# Patient Record
Sex: Female | Born: 1960 | Race: Black or African American | Hispanic: No | Marital: Single | Smoking: Never smoker
Health system: Southern US, Community
[De-identification: ages and names within clinical notes are randomized; demographics above are authoritative.]

## PROBLEM LIST (undated history)

## (undated) DIAGNOSIS — I10 Essential (primary) hypertension: Secondary | ICD-10-CM

## (undated) DIAGNOSIS — E119 Type 2 diabetes mellitus without complications: Secondary | ICD-10-CM

## (undated) DIAGNOSIS — F419 Anxiety disorder, unspecified: Secondary | ICD-10-CM

## (undated) DIAGNOSIS — E7800 Pure hypercholesterolemia, unspecified: Secondary | ICD-10-CM

## (undated) HISTORY — PX: TUBAL LIGATION: SHX77

---

## 2005-01-30 ENCOUNTER — Ambulatory Visit: Payer: Self-pay | Admitting: Family Medicine

## 2005-12-03 ENCOUNTER — Ambulatory Visit: Payer: Self-pay | Admitting: Family Medicine

## 2005-12-06 ENCOUNTER — Ambulatory Visit: Payer: Self-pay | Admitting: Family Medicine

## 2009-12-07 ENCOUNTER — Emergency Department (HOSPITAL_COMMUNITY): Admission: EM | Admit: 2009-12-07 | Discharge: 2009-12-07 | Payer: Self-pay | Admitting: Emergency Medicine

## 2010-11-20 ENCOUNTER — Emergency Department (HOSPITAL_COMMUNITY)
Admission: EM | Admit: 2010-11-20 | Discharge: 2010-11-20 | Disposition: A | Discharge status: Home / Self Care | Payer: BC Managed Care – PPO | Attending: Emergency Medicine | Admitting: Emergency Medicine

## 2010-11-20 ENCOUNTER — Emergency Department (HOSPITAL_COMMUNITY): Payer: BC Managed Care – PPO

## 2010-11-20 DIAGNOSIS — R059 Cough, unspecified: Secondary | ICD-10-CM | POA: Insufficient documentation

## 2010-11-20 DIAGNOSIS — R05 Cough: Secondary | ICD-10-CM | POA: Insufficient documentation

## 2010-11-20 DIAGNOSIS — J029 Acute pharyngitis, unspecified: Secondary | ICD-10-CM | POA: Insufficient documentation

## 2010-11-20 LAB — RAPID STREP SCREEN (MED CTR MEBANE ONLY): Streptococcus, Group A Screen (Direct): NEGATIVE

## 2012-07-31 IMAGING — CR DG CHEST 2V
2 series · 2 of 2 positions shown · non-contrast
Comparison: None

CLINICAL DATA: Sore throat and cough

CHEST - 2 VIEW

[view not recorded (1 of 2)]
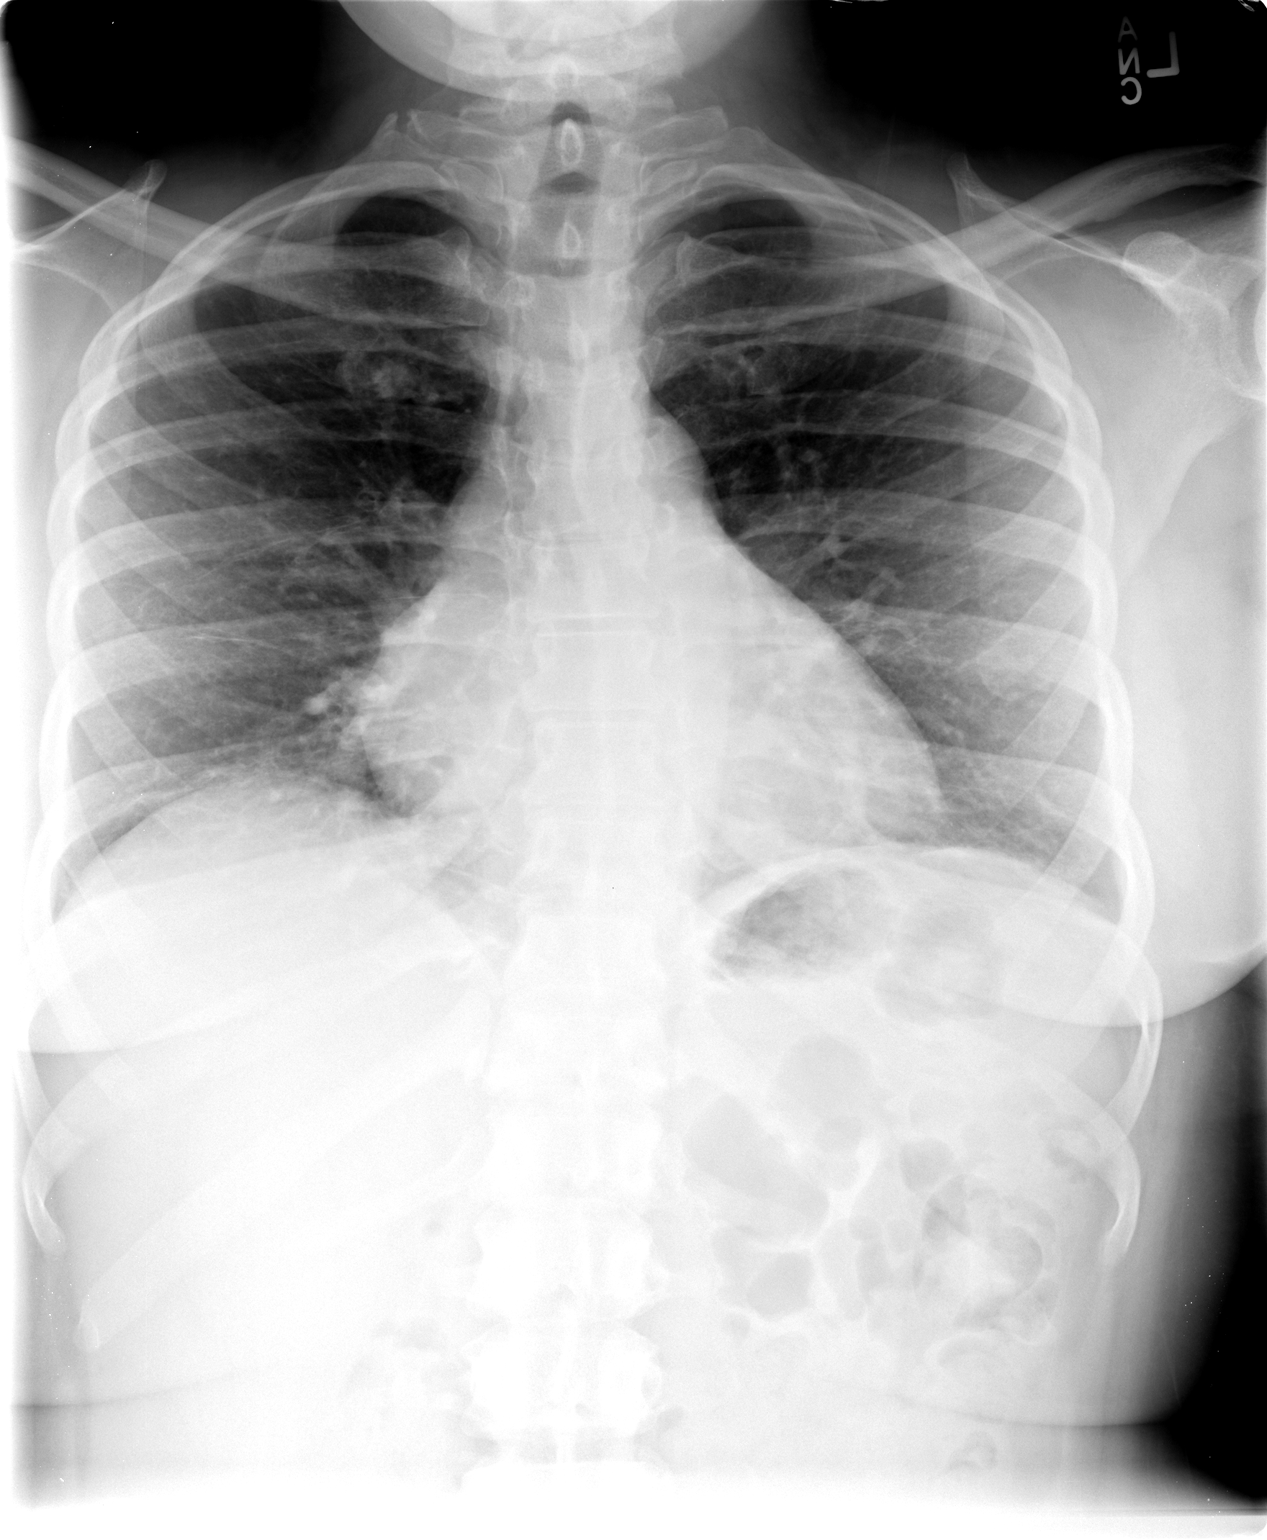

[view not recorded (2 of 2)]
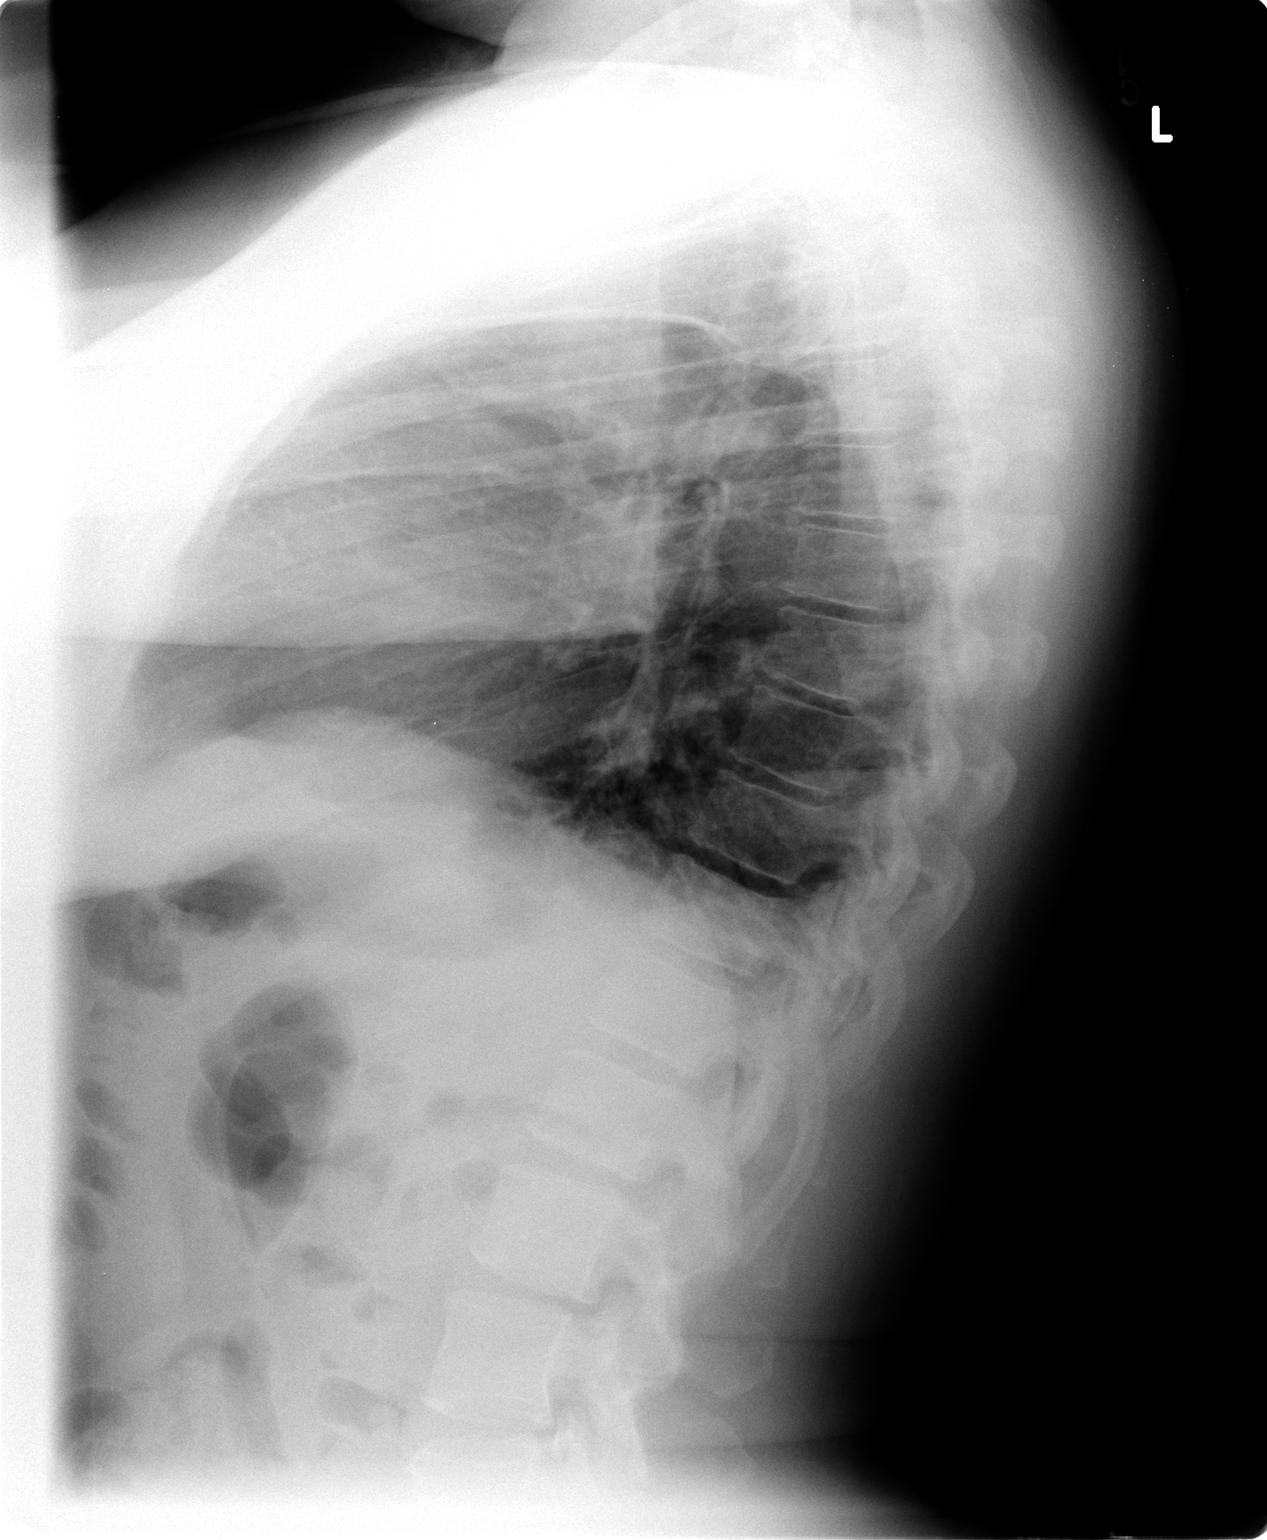

[2 of 2 positions shown; findings below may reference images not displayed]

FINDINGS: The heart size is normal.

No pleural effusion or pulmonary edema.

The lung volumes are low and there is mild bibasilar atelectasis.

No airspace consolidation is identified.

Coarsened interstitial markings are noted bilaterally.
IMPRESSION: 1.  Low lung volumes.
2. Chronic interstitial coarsening.

## 2013-11-30 DIAGNOSIS — D509 Iron deficiency anemia, unspecified: Secondary | ICD-10-CM | POA: Insufficient documentation

## 2015-02-21 DIAGNOSIS — I152 Hypertension secondary to endocrine disorders: Secondary | ICD-10-CM | POA: Insufficient documentation

## 2015-02-21 DIAGNOSIS — E1159 Type 2 diabetes mellitus with other circulatory complications: Secondary | ICD-10-CM | POA: Insufficient documentation

## 2019-08-06 ENCOUNTER — Emergency Department (HOSPITAL_COMMUNITY)
Admission: EM | Admit: 2019-08-06 | Discharge: 2019-08-06 | Disposition: A | Discharge status: Home / Self Care | Payer: BC Managed Care – PPO | Attending: Emergency Medicine | Admitting: Emergency Medicine

## 2019-08-06 ENCOUNTER — Encounter (HOSPITAL_COMMUNITY): Payer: Self-pay

## 2019-08-06 ENCOUNTER — Emergency Department (HOSPITAL_COMMUNITY): Payer: BC Managed Care – PPO

## 2019-08-06 ENCOUNTER — Other Ambulatory Visit: Payer: Self-pay

## 2019-08-06 DIAGNOSIS — R1010 Upper abdominal pain, unspecified: Secondary | ICD-10-CM | POA: Diagnosis present

## 2019-08-06 DIAGNOSIS — K5900 Constipation, unspecified: Secondary | ICD-10-CM | POA: Insufficient documentation

## 2019-08-06 DIAGNOSIS — E119 Type 2 diabetes mellitus without complications: Secondary | ICD-10-CM | POA: Diagnosis not present

## 2019-08-06 DIAGNOSIS — I10 Essential (primary) hypertension: Secondary | ICD-10-CM | POA: Insufficient documentation

## 2019-08-06 DIAGNOSIS — Z7984 Long term (current) use of oral hypoglycemic drugs: Secondary | ICD-10-CM | POA: Diagnosis not present

## 2019-08-06 DIAGNOSIS — Z79899 Other long term (current) drug therapy: Secondary | ICD-10-CM | POA: Diagnosis not present

## 2019-08-06 DIAGNOSIS — R109 Unspecified abdominal pain: Secondary | ICD-10-CM

## 2019-08-06 HISTORY — DX: Essential (primary) hypertension: I10

## 2019-08-06 HISTORY — DX: Type 2 diabetes mellitus without complications: E11.9

## 2019-08-06 HISTORY — DX: Pure hypercholesterolemia, unspecified: E78.00

## 2019-08-06 HISTORY — DX: Anxiety disorder, unspecified: F41.9

## 2019-08-06 LAB — CBC WITH DIFFERENTIAL/PLATELET
Abs Immature Granulocytes: 0.01 10*3/uL (ref 0.00–0.07)
Basophils Absolute: 0 10*3/uL (ref 0.0–0.1)
Basophils Relative: 0 %
Eosinophils Absolute: 0 10*3/uL (ref 0.0–0.5)
Eosinophils Relative: 1 %
HCT: 36 % (ref 36.0–46.0)
Hemoglobin: 11.4 g/dL — ABNORMAL LOW (ref 12.0–15.0)
Immature Granulocytes: 0 %
Lymphocytes Relative: 23 %
Lymphs Abs: 1.4 10*3/uL (ref 0.7–4.0)
MCH: 30.7 pg (ref 26.0–34.0)
MCHC: 31.7 g/dL (ref 30.0–36.0)
MCV: 97 fL (ref 80.0–100.0)
Monocytes Absolute: 0.6 10*3/uL (ref 0.1–1.0)
Monocytes Relative: 10 %
Neutro Abs: 4.1 10*3/uL (ref 1.7–7.7)
Neutrophils Relative %: 66 %
Platelets: 315 10*3/uL (ref 150–400)
RBC: 3.71 MIL/uL — ABNORMAL LOW (ref 3.87–5.11)
RDW: 12.8 % (ref 11.5–15.5)
WBC: 6.1 10*3/uL (ref 4.0–10.5)
nRBC: 0 % (ref 0.0–0.2)

## 2019-08-06 LAB — COMPREHENSIVE METABOLIC PANEL
ALT: 21 U/L (ref 0–44)
AST: 19 U/L (ref 15–41)
Albumin: 4 g/dL (ref 3.5–5.0)
Alkaline Phosphatase: 56 U/L (ref 38–126)
Anion gap: 9 (ref 5–15)
BUN: 12 mg/dL (ref 6–20)
CO2: 27 mmol/L (ref 22–32)
Calcium: 9.3 mg/dL (ref 8.9–10.3)
Chloride: 100 mmol/L (ref 98–111)
Creatinine, Ser: 0.85 mg/dL (ref 0.44–1.00)
GFR calc Af Amer: >60 mL/min (ref >60)
GFR calc non Af Amer: >60 mL/min (ref >60)
Glucose, Bld: 178 mg/dL — ABNORMAL HIGH (ref 70–99)
Potassium: 3.6 mmol/L (ref 3.5–5.1)
Sodium: 136 mmol/L (ref 135–145)
Total Bilirubin: 0.8 mg/dL (ref 0.3–1.2)
Total Protein: 8.5 g/dL — ABNORMAL HIGH (ref 6.5–8.1)

## 2019-08-06 LAB — LIPASE, BLOOD: Lipase: 21 U/L (ref 11–51)

## 2019-08-06 MED ORDER — DICYCLOMINE HCL 20 MG PO TABS: 20.0000 mg | ORAL_TABLET | Freq: Two times a day (BID) | ORAL | 0 refills | Status: DC

## 2019-08-06 NOTE — Discharge Instructions (Addendum)
Test showed no life-threatening condition.  X-ray showed no obvious constipation.  Prescription for abdominal cramping sent to your pharmacy.  Follow-up with your primary care doctor.

## 2019-08-06 NOTE — ED Provider Notes (Addendum)
[facility] EMERGENCY DEPARTMENT Provider Note   CSN: [id number] Arrival date & time: 08/06/19  1007     History Chief Complaint  Patient presents with  . Abdominal Pain    Kiara Kent is a 59 y.o. female.  Upper abdominal pain since Sunday with poor appetite.  No bowel movement 7 days.  Patient has taken Linzess x2 in the past several days without relief.  Past surgical history includes bilateral tubal ligation and cystectomy on ovary.  This pain is atypical.  No fever, sweats, chills, chest pain, dyspnea.  One episode of vomiting on Monday.        Past Medical History:  Diagnosis Date  . Anxiety   . Diabetes mellitus without complication ([place])   . Hypercholesterolemia   . Hypertension     There are no problems to display for this patient.   Past Surgical History:  Procedure Laterality Date  . TUBAL LIGATION       OB History   No obstetric history on file.     No family history on file.  Social History   Tobacco Use  . Smoking status: Never Smoker  . Smokeless tobacco: Never Used  Substance Use Topics  . Alcohol use: Never  . Drug use: Never    Home Medications Prior to Admission medications   Medication Sig Start Date End Date Taking? Authorizing Provider  amLODipine (NORVASC) 5 MG tablet Take 5 mg by mouth daily.   Yes [provider]  buPROPion (WELLBUTRIN XL) 150 MG 24 hr tablet Take 150 mg by mouth every morning. 07/27/19  Yes [provider]  lisinopril (ZESTRIL) 20 MG tablet Take 20 mg by mouth daily.   Yes [provider]  metFORMIN (GLUCOPHAGE) 500 MG tablet Take 500 mg by mouth 2 (two) times daily. 06/06/19  Yes [provider]  simvastatin (ZOCOR) 40 MG tablet Take 40 mg by mouth daily.   Yes [provider]  dicyclomine (BENTYL) 20 MG tablet Take 1 tablet (20 mg total) by mouth 2 (two) times daily. 08/06/19   Nat Christen, MD    Allergies    Patient has no known allergies.  Review of Systems    Review of Systems  All other systems reviewed and are negative.   Physical Exam Updated Vital Signs BP 111/89 (BP Location: Left Arm)   Pulse (!) 115   Temp 99.4 F (37.4 C) (Oral)   Resp 18   Ht 5\' 10"  (1.778 m)   Wt 91.2 kg   SpO2 98%   BMI 28.84 kg/m   Physical Exam Vitals and nursing note reviewed.  Constitutional:      Appearance: She is well-developed.     Comments: nad  HENT:     Head: Normocephalic and atraumatic.  Eyes:     Conjunctiva/sclera: Conjunctivae normal.  Cardiovascular:     Rate and Rhythm: Normal rate and regular rhythm.  Pulmonary:     Effort: Pulmonary effort is normal.     Breath sounds: Normal breath sounds.  Abdominal:     General: Bowel sounds are normal.     Palpations: Abdomen is soft.     Comments: Minimal upper abdominal tenderness.  Musculoskeletal:        General: Normal range of motion.     Cervical back: Neck supple.  Skin:    General: Skin is warm and dry.  Neurological:     General: No focal deficit present.     Mental Status: She is alert and  oriented to person, place, and time.  Psychiatric:        Behavior: Behavior normal.     ED Results / Procedures / Treatments   Labs (all labs ordered are listed, but only abnormal results are displayed) Labs Reviewed  CBC WITH DIFFERENTIAL/PLATELET - Abnormal; Notable for the following components:      Result Value   RBC 3.71 (*)    Hemoglobin 11.4 (*)    All other components within normal limits  COMPREHENSIVE METABOLIC PANEL - Abnormal; Notable for the following components:   Glucose, Bld 178 (*)    Total Protein 8.5 (*)    All other components within normal limits  LIPASE, BLOOD    EKG None  Radiology DG Abdomen Acute W/Chest  Result Date: 08/06/2019 CLINICAL DATA:  Constipation with abdominal pain EXAM: DG ABDOMEN ACUTE W/ 1V CHEST COMPARISON:  None. FINDINGS: There is no evidence of dilated bowel loops or free intraperitoneal air. Mild amount of stool in the colon.  No radiopaque calculi or other significant radiographic abnormality is seen. Heart size and mediastinal contours are within normal limits. Both lungs are clear. IMPRESSION: Negative abdominal radiographs.  No acute cardiopulmonary disease. Electronically Signed   By: Marlan Palau M.D.   On: 08/06/2019 10:19    Procedures Procedures (including critical care time)  Medications Ordered in ED Medications - No data to display  ED Course  I have reviewed the triage vital signs and the nursing notes.  Pertinent labs & imaging results that were available during my care of the patient were reviewed by me and considered in my medical decision making (see chart for details).    MDM Rules/Calculators/A&P                      Patient is in no acute distress.  Could be simple constipation versus SBO.  Basic labs, acute abdominal series.  1230: Patient rechecked prior to discharge.  No acute abdomen.  Discussed labs and x-ray.  Discharge medication Bentyl 20 mg. Final Clinical Impression(s) / ED Diagnoses Final diagnoses:  Abdominal pain, unspecified abdominal location  Constipation, unspecified constipation type    Rx / DC Orders ED Discharge Orders         Ordered    dicyclomine (BENTYL) 20 MG tablet  2 times daily     08/06/19 1240           Donnetta Hutching, MD 08/06/19 7253    Donnetta Hutching, MD 08/06/19 1241

## 2019-08-06 NOTE — ED Triage Notes (Addendum)
Pt reports mid to upper abd pain since Sunday.  Reports has been belching a lot and constipated.  LBM was 7 days ago.  Vomited x 1 on Monday.  Denies any chest pain.

## 2019-08-20 ENCOUNTER — Encounter (HOSPITAL_COMMUNITY): Payer: Self-pay

## 2019-08-20 ENCOUNTER — Encounter (HOSPITAL_COMMUNITY)
Admission: RE | Admit: 2019-08-20 | Discharge: 2019-08-20 | Disposition: A | Discharge status: Home / Self Care | Payer: BC Managed Care – PPO | Source: Ambulatory Visit | Attending: General Surgery | Admitting: General Surgery

## 2019-08-20 ENCOUNTER — Encounter: Payer: Self-pay | Admitting: General Surgery

## 2019-08-20 ENCOUNTER — Emergency Department (HOSPITAL_COMMUNITY)
Admission: EM | Admit: 2019-08-20 | Discharge: 2019-08-20 | Disposition: A | Discharge status: Home / Self Care | Payer: BC Managed Care – PPO | Attending: Emergency Medicine | Admitting: Emergency Medicine

## 2019-08-20 ENCOUNTER — Other Ambulatory Visit: Payer: Self-pay

## 2019-08-20 ENCOUNTER — Emergency Department (HOSPITAL_COMMUNITY): Payer: BC Managed Care – PPO

## 2019-08-20 ENCOUNTER — Ambulatory Visit: Payer: BC Managed Care – PPO | Admitting: General Surgery

## 2019-08-20 VITALS — BP 124/83 | HR 89 | Temp 98.2°F | Resp 12 | Ht 70.0 in | Wt 201.0 lb

## 2019-08-20 DIAGNOSIS — Z7984 Long term (current) use of oral hypoglycemic drugs: Secondary | ICD-10-CM | POA: Diagnosis not present

## 2019-08-20 DIAGNOSIS — Z79899 Other long term (current) drug therapy: Secondary | ICD-10-CM | POA: Diagnosis not present

## 2019-08-20 DIAGNOSIS — E119 Type 2 diabetes mellitus without complications: Secondary | ICD-10-CM | POA: Diagnosis not present

## 2019-08-20 DIAGNOSIS — K8010 Calculus of gallbladder with chronic cholecystitis without obstruction: Secondary | ICD-10-CM | POA: Diagnosis not present

## 2019-08-20 DIAGNOSIS — K819 Cholecystitis, unspecified: Secondary | ICD-10-CM | POA: Insufficient documentation

## 2019-08-20 DIAGNOSIS — R1033 Periumbilical pain: Secondary | ICD-10-CM

## 2019-08-20 DIAGNOSIS — I10 Essential (primary) hypertension: Secondary | ICD-10-CM | POA: Diagnosis not present

## 2019-08-20 LAB — COMPREHENSIVE METABOLIC PANEL
ALT: 13 U/L (ref 0–44)
AST: 12 U/L — ABNORMAL LOW (ref 15–41)
Albumin: 4.3 g/dL (ref 3.5–5.0)
Alkaline Phosphatase: 53 U/L (ref 38–126)
Anion gap: 10 (ref 5–15)
BUN: 9 mg/dL (ref 6–20)
CO2: 27 mmol/L (ref 22–32)
Calcium: 9.5 mg/dL (ref 8.9–10.3)
Chloride: 100 mmol/L (ref 98–111)
Creatinine, Ser: 0.75 mg/dL (ref 0.44–1.00)
GFR calc Af Amer: >60 mL/min (ref >60)
GFR calc non Af Amer: >60 mL/min (ref >60)
Glucose, Bld: 225 mg/dL — ABNORMAL HIGH (ref 70–99)
Potassium: 3.6 mmol/L (ref 3.5–5.1)
Sodium: 137 mmol/L (ref 135–145)
Total Bilirubin: 0.5 mg/dL (ref 0.3–1.2)
Total Protein: 9 g/dL — ABNORMAL HIGH (ref 6.5–8.1)

## 2019-08-20 LAB — URINALYSIS, ROUTINE W REFLEX MICROSCOPIC
Bilirubin Urine: NEGATIVE
Glucose, UA: NEGATIVE mg/dL
Hgb urine dipstick: NEGATIVE
Ketones, ur: NEGATIVE mg/dL
Leukocytes,Ua: NEGATIVE
Nitrite: NEGATIVE
Protein, ur: NEGATIVE mg/dL
Specific Gravity, Urine: >1.046 — ABNORMAL HIGH (ref 1.005–1.030)
pH: 6 (ref 5.0–8.0)

## 2019-08-20 LAB — CBC WITH DIFFERENTIAL/PLATELET
Abs Immature Granulocytes: 0.01 10*3/uL (ref 0.00–0.07)
Basophils Absolute: 0 10*3/uL (ref 0.0–0.1)
Basophils Relative: 0 %
Eosinophils Absolute: 0 10*3/uL (ref 0.0–0.5)
Eosinophils Relative: 0 %
HCT: 36.6 % (ref 36.0–46.0)
Hemoglobin: 11.6 g/dL — ABNORMAL LOW (ref 12.0–15.0)
Immature Granulocytes: 0 %
Lymphocytes Relative: 17 %
Lymphs Abs: 0.9 10*3/uL (ref 0.7–4.0)
MCH: 30.5 pg (ref 26.0–34.0)
MCHC: 31.7 g/dL (ref 30.0–36.0)
MCV: 96.3 fL (ref 80.0–100.0)
Monocytes Absolute: 0.3 10*3/uL (ref 0.1–1.0)
Monocytes Relative: 5 %
Neutro Abs: 4.1 10*3/uL (ref 1.7–7.7)
Neutrophils Relative %: 78 %
Platelets: 324 10*3/uL (ref 150–400)
RBC: 3.8 MIL/uL — ABNORMAL LOW (ref 3.87–5.11)
RDW: 12.7 % (ref 11.5–15.5)
WBC: 5.3 10*3/uL (ref 4.0–10.5)
nRBC: 0 % (ref 0.0–0.2)

## 2019-08-20 LAB — LIPASE, BLOOD: Lipase: 21 U/L (ref 11–51)

## 2019-08-20 MED ORDER — ONDANSETRON HCL 8 MG PO TABS: 8.0000 mg | ORAL_TABLET | ORAL | 0 refills | Status: DC | PRN

## 2019-08-20 MED ORDER — ONDANSETRON HCL 4 MG/2ML IJ SOLN
4.0000 mg | Freq: Once | INTRAMUSCULAR | Status: AC
  Administered 2019-08-20: 4 mg via INTRAVENOUS
  Filled 2019-08-20: qty 2

## 2019-08-20 MED ORDER — IOHEXOL 300 MG/ML  SOLN
100.0000 mL | Freq: Once | INTRAMUSCULAR | Status: AC | PRN
  Administered 2019-08-20: 07:00:00 100 mL via INTRAVENOUS

## 2019-08-20 MED ORDER — SODIUM CHLORIDE 0.9 % IV BOLUS
1000.0000 mL | Freq: Once | INTRAVENOUS | Status: AC
  Administered 2019-08-20: 1000 mL via INTRAVENOUS

## 2019-08-20 MED ORDER — OXYCODONE-ACETAMINOPHEN 5-325 MG PO TABS: 1.0000 | ORAL_TABLET | ORAL | 0 refills | Status: DC | PRN

## 2019-08-20 MED ORDER — FENTANYL CITRATE (PF) 100 MCG/2ML IJ SOLN
50.0000 ug | Freq: Once | INTRAMUSCULAR | Status: AC
  Administered 2019-08-20: 50 ug via INTRAVENOUS
  Filled 2019-08-20: qty 2

## 2019-08-20 MED ORDER — PANTOPRAZOLE SODIUM 20 MG PO TBEC: 20.0000 mg | DELAYED_RELEASE_TABLET | Freq: Two times a day (BID) | ORAL | 0 refills | Status: DC

## 2019-08-20 NOTE — Progress Notes (Signed)
[facility] History and Physical  Reason for Referral: Gallstones  Referring Physician:  Dr. Adriana Simas (ED)  Chief Complaint    New Patient (Initial Visit)      Kiara Kent is a 59 y.o. female.  HPI:  Kiara Kent is a 59 yo who went to the ED at [facility] and had been having 2 weeks of upper abdominal pain. She has a history of constipation and IBS and is on Linzess and was started on Bentyl. She says the pain has continued and is worsening. She says that at first it was aggravated by food but now is constant and is a ache.  She says it is mostly in the upper abdomen.  She has associated nausea and some vomiting. She says she really has not eaten well in a few days. She has a vomit bag with her in the clinic.  She was seen in the Ed this AM and worked up with labs and a CT. The CT demonstrated gallstones and thickened gallbladder concerning for some degree of cholecystitis. She denies any fevers or chills.    Past Medical History:  Diagnosis Date  . Anxiety   . Diabetes mellitus without complication ([facility])   . Hypercholesterolemia   . Hypertension     Past Surgical History:  Procedure Laterality Date  . TUBAL LIGATION      Family History  Problem Relation Age of Onset  . Diabetes Mother   . Prostate cancer Father     Social History   Tobacco Use  . Smoking status: Never Smoker  . Smokeless tobacco: Never Used  Substance Use Topics  . Alcohol use: Never  . Drug use: Never    Medications: I have reviewed the patient's current medications. Allergies as of 08/20/2019   No Known Allergies     Medication List       Accurate as of August 20, 2019 12:05 PM. If you have any questions, ask your nurse or doctor.        amLODipine 5 MG tablet Commonly known as: NORVASC Take 5 mg by mouth daily.   buPROPion 150 MG 24 hr tablet Commonly known as: WELLBUTRIN XL Take 150 mg by mouth every morning.   dicyclomine 20 MG tablet Commonly known as:  BENTYL Take 1 tablet (20 mg total) by mouth 2 (two) times daily.   lisinopril 20 MG tablet Commonly known as: ZESTRIL Take 20 mg by mouth daily.   metFORMIN 500 MG tablet Commonly known as: GLUCOPHAGE Take 500 mg by mouth 2 (two) times daily.   ondansetron 8 MG tablet Commonly known as: Zofran Take 1 tablet (8 mg total) by mouth every 4 (four) hours as needed.   oxyCODONE-acetaminophen 5-325 MG tablet Commonly known as: Percocet Take 1 tablet by mouth every 4 (four) hours as needed.   pantoprazole 20 MG tablet Commonly known as: PROTONIX Take 1 tablet (20 mg total) by mouth 2 (two) times daily.   simvastatin 40 MG tablet Commonly known as: ZOCOR Take 40 mg by mouth daily.        ROS:  A comprehensive review of systems was negative except for: Gastrointestinal: positive for abdominal pain, constipation, nausea and vomiting  Blood pressure 124/83, pulse 89, temperature 98.2 F (36.8 C), temperature source Oral, resp. rate 12, height 5\' 10"  (1.778 m), weight 201 lb (91.2 kg), SpO2 94 %. Physical Exam Vitals reviewed.  Constitutional:      Appearance: Normal appearance.  HENT:     Head: Normocephalic  and atraumatic.     Nose: Nose normal.     Mouth/Throat:     Mouth: Mucous membranes are moist.  Eyes:     Extraocular Movements: Extraocular movements intact.     Pupils: Pupils are equal, round, and reactive to light.  Cardiovascular:     Rate and Rhythm: Normal rate and regular rhythm.  Pulmonary:     Effort: Pulmonary effort is normal.     Breath sounds: Normal breath sounds.  Abdominal:     General: There is no distension.     Palpations: Abdomen is soft.     Tenderness: There is abdominal tenderness in the right upper quadrant and epigastric area. There is no guarding or rebound.  Musculoskeletal:        General: No swelling. Normal range of motion.     Cervical back: Normal range of motion. No rigidity.  Skin:    General: Skin is warm and dry.   Neurological:     General: No focal deficit present.     Mental Status: She is alert and oriented to person, place, and time.  Psychiatric:        Mood and Affect: Mood normal.        Behavior: Behavior normal.        Thought Content: Thought content normal.        Judgment: Judgment normal.     Results: Results for orders placed or performed during the hospital encounter of 08/20/19 (from the past 48 hour(s))  Comprehensive metabolic panel     Status: Abnormal   Collection Time: 08/20/19  6:26 AM  Result Value Ref Range   Sodium 137 135 - 145 mmol/L   Potassium 3.6 3.5 - 5.1 mmol/L   Chloride 100 98 - 111 mmol/L   CO2 27 22 - 32 mmol/L   Glucose, Bld 225 (H) 70 - 99 mg/dL    Comment: Glucose reference range applies only to samples taken after fasting for at least 8 hours.   BUN 9 6 - 20 mg/dL   Creatinine, Ser 8.85 0.44 - 1.00 mg/dL   Calcium 9.5 8.9 - 02.7 mg/dL   Total Protein 9.0 (H) 6.5 - 8.1 g/dL   Albumin 4.3 3.5 - 5.0 g/dL   AST 12 (L) 15 - 41 U/L   ALT 13 0 - 44 U/L   Alkaline Phosphatase 53 38 - 126 U/L   Total Bilirubin 0.5 0.3 - 1.2 mg/dL   GFR calc non Af Amer >60 >60 mL/min   GFR calc Af Amer >60 >60 mL/min   Anion gap 10 5 - 15    Comment: Performed at [facility], [address], [place], [place] [address]  Lipase, blood     Status: None   Collection Time: 08/20/19  6:26 AM  Result Value Ref Range   Lipase 21 11 - 51 U/L    Comment: Performed at [facility], [address], [place], [place] [address]  CBC with Differential     Status: Abnormal   Collection Time: 08/20/19  6:26 AM  Result Value Ref Range   WBC 5.3 4.0 - 10.5 K/uL   RBC 3.80 (L) 3.87 - 5.11 MIL/uL   Hemoglobin 11.6 (L) 12.0 - 15.0 g/dL   HCT 72.0 94.7 - 09.6 %   MCV 96.3 80.0 - 100.0 fL   MCH 30.5 26.0 - 34.0 pg   MCHC 31.7 30.0 - 36.0 g/dL   RDW 28.3 66.2 - 94.7 %   Platelets 324  150 - 400 K/uL   nRBC 0.0 0.0 - 0.2 %   Neutrophils Relative % 78 %   Neutro Abs 4.1 1.7 - 7.7 K/uL    Lymphocytes Relative 17 %   Lymphs Abs 0.9 0.7 - 4.0 K/uL   Monocytes Relative 5 %   Monocytes Absolute 0.3 0.1 - 1.0 K/uL   Eosinophils Relative 0 %   Eosinophils Absolute 0.0 0.0 - 0.5 K/uL   Basophils Relative 0 %   Basophils Absolute 0.0 0.0 - 0.1 K/uL   Immature Granulocytes 0 %   Abs Immature Granulocytes 0.01 0.00 - 0.07 K/uL    Comment: Performed at [facility], [address] [place], [place] [address]  Urinalysis, Routine w reflex microscopic     Status: Abnormal   Collection Time: 08/20/19 10:07 AM  Result Value Ref Range   Color, Urine YELLOW YELLOW   APPearance HAZY (A) CLEAR   Specific Gravity, Urine >1.046 (H) 1.005 - 1.030   pH 6.0 5.0 - 8.0   Glucose, UA NEGATIVE NEGATIVE mg/dL   Hgb urine dipstick NEGATIVE NEGATIVE   Bilirubin Urine NEGATIVE NEGATIVE   Ketones, ur NEGATIVE NEGATIVE mg/dL   Protein, ur NEGATIVE NEGATIVE mg/dL   Nitrite NEGATIVE NEGATIVE   Leukocytes,Ua NEGATIVE NEGATIVE    Comment: Performed at [facility], [address], [place], [place] [address]   Personally reviewed CT- distended gallbladder with stones, thickening wall and some fluid around gallbladder   DG Abdomen 1 View  Result Date: 08/20/2019 CLINICAL DATA:  No bowel movement for 1 week, mid abdominal pain for 15 days EXAM: ABDOMEN - 1 VIEW COMPARISON:  Acute abdominal series 08/06/2019 FINDINGS: No high-grade obstructive bowel gas pattern or large stool burden is seen. No suspicious calcifications over the gallbladder or renal shadows. Lung bases are clear. Inferior cardiomediastinal contours are unremarkable. Mild degenerative changes in the lower lumbar spine and pelvis. Stable appearance of a phlebolith in the right hemipelvis. Bones and soft tissues are otherwise unremarkable. IMPRESSION: No acute findings. No evidence of bowel obstruction or large stool burden. Electronically Signed   By: Lovena Le M.D.   On: 08/20/2019 06:02   CT Abdomen Pelvis W Contrast  Result Date:  08/20/2019 CLINICAL DATA:  Periumbilical abdominal pain for the past 2 weeks EXAM: CT ABDOMEN AND PELVIS WITH CONTRAST TECHNIQUE: Multidetector CT imaging of the abdomen and pelvis was performed using the standard protocol following bolus administration of intravenous contrast. CONTRAST:  138mL OMNIPAQUE IOHEXOL 300 MG/ML  SOLN COMPARISON:  None available FINDINGS: Lower chest:  No contributory findings. Hepatobiliary: No focal liver abnormality.Multiple calcified gallstones with gallbladder wall thickening and mild pericholecystic edema. No bile duct dilatation or visible choledocholithiasis. Pancreas: Unremarkable. Spleen: Unremarkable. Adrenals/Urinary Tract: Negative adrenals. No hydronephrosis or stone. Unremarkable bladder. Stomach/Bowel:  No obstruction. No appendicitis. Vascular/Lymphatic: No acute vascular abnormality. No mass or adenopathy. Reproductive:Small intramural fibroids ventrally and dorsally at the body, up to 2.5 cm. Other: No ascites or pneumoperitoneum. Musculoskeletal: No acute abnormalities. IMPRESSION: 1. Cholelithiasis and possible cholecystitis. 2. Intramural fibroids. Electronically Signed   By: Monte Fantasia M.D.   On: 08/20/2019 07:49     Assessment & Plan:  Karlei Waldo is a 59 y.o. female with cholelithiasis on imaging and findings and history concerning for some degree of cholecystitis. She looks very uncomfortable today.  She is not taking in a lot of liquids or food.   -OR for laparoscopic cholecystectomy tomorrow -Preoperative labs done in the ED, and COVID rapid in [place]  PLAN: I counseled the patient about the indication, risks and benefits of laparoscopic cholecystectomy.  She understands there is a very small chance for bleeding, infection, injury to normal structures (including common bile duct), conversion to open surgery, persistent symptoms, evolution of postcholecystectomy diarrhea, need for secondary interventions, anesthesia reaction, cardiopulmonary issues  and other risks not specifically detailed here. I described the expected recovery, the plan for follow-up and the restrictions during the recovery phase.  All questions were answered.   All questions were answered to the satisfaction of the patient.   Kiara Kent 08/20/2019, 12:05 PM

## 2019-08-20 NOTE — H&P (Signed)
[facility] History and Physical  Reason for Referral: Gallstones  Referring Physician:  Dr. Cook (ED)  Chief Complaint    New Patient (Initial Visit)      Kiara Kent is a 58 y.o. female.  HPI:  Kiara Kent is a 58 yo who went to the ED at [place] and had been having 2 weeks of upper abdominal pain. She has a history of constipation and IBS and is on Linzess and was started on Bentyl. She says the pain has continued and is worsening. She says that at first it was aggravated by food but now is constant and is a ache.  She says it is mostly in the upper abdomen.  She has associated nausea and some vomiting. She says she really has not eaten well in a few days. She has a vomit bag with her in the clinic.  She was seen in the Ed this AM and worked up with labs and a CT. The CT demonstrated gallstones and thickened gallbladder concerning for some degree of cholecystitis. She denies any fevers or chills.    Past Medical History:  Diagnosis Date  . Anxiety   . Diabetes mellitus without complication (HCC)   . Hypercholesterolemia   . Hypertension     Past Surgical History:  Procedure Laterality Date  . TUBAL LIGATION      Family History  Problem Relation Age of Onset  . Diabetes Mother   . Prostate cancer Father     Social History   Tobacco Use  . Smoking status: Never Smoker  . Smokeless tobacco: Never Used  Substance Use Topics  . Alcohol use: Never  . Drug use: Never    Medications: I have reviewed the patient's current medications. Allergies as of 08/20/2019   No Known Allergies     Medication List       Accurate as of August 20, 2019 12:05 PM. If you have any questions, ask your nurse or doctor.        amLODipine 5 MG tablet Commonly known as: NORVASC Take 5 mg by mouth daily.   buPROPion 150 MG 24 hr tablet Commonly known as: WELLBUTRIN XL Take 150 mg by mouth every morning.   dicyclomine 20 MG tablet Commonly known as:  BENTYL Take 1 tablet (20 mg total) by mouth 2 (two) times daily.   lisinopril 20 MG tablet Commonly known as: ZESTRIL Take 20 mg by mouth daily.   metFORMIN 500 MG tablet Commonly known as: GLUCOPHAGE Take 500 mg by mouth 2 (two) times daily.   ondansetron 8 MG tablet Commonly known as: Zofran Take 1 tablet (8 mg total) by mouth every 4 (four) hours as needed.   oxyCODONE-acetaminophen 5-325 MG tablet Commonly known as: Percocet Take 1 tablet by mouth every 4 (four) hours as needed.   pantoprazole 20 MG tablet Commonly known as: PROTONIX Take 1 tablet (20 mg total) by mouth 2 (two) times daily.   simvastatin 40 MG tablet Commonly known as: ZOCOR Take 40 mg by mouth daily.        ROS:  A comprehensive review of systems was negative except for: Gastrointestinal: positive for abdominal pain, constipation, nausea and vomiting  Blood pressure 124/83, pulse 89, temperature 98.2 F (36.8 C), temperature source Oral, resp. rate 12, height 5' 10" (1.778 m), weight 201 lb (91.2 kg), SpO2 94 %. Physical Exam Vitals reviewed.  Constitutional:      Appearance: Normal appearance.  HENT:     Head: Normocephalic   and atraumatic.     Nose: Nose normal.     Mouth/Throat:     Mouth: Mucous membranes are moist.  Eyes:     Extraocular Movements: Extraocular movements intact.     Pupils: Pupils are equal, round, and reactive to light.  Cardiovascular:     Rate and Rhythm: Normal rate and regular rhythm.  Pulmonary:     Effort: Pulmonary effort is normal.     Breath sounds: Normal breath sounds.  Abdominal:     General: There is no distension.     Palpations: Abdomen is soft.     Tenderness: There is abdominal tenderness in the right upper quadrant and epigastric area. There is no guarding or rebound.  Musculoskeletal:        General: No swelling. Normal range of motion.     Cervical back: Normal range of motion. No rigidity.  Skin:    General: Skin is warm and dry.   Neurological:     General: No focal deficit present.     Mental Status: She is alert and oriented to person, place, and time.  Psychiatric:        Mood and Affect: Mood normal.        Behavior: Behavior normal.        Thought Content: Thought content normal.        Judgment: Judgment normal.     Results: Results for orders placed or performed during the hospital encounter of 08/20/19 (from the past 48 hour(s))  Comprehensive metabolic panel     Status: Abnormal   Collection Time: 08/20/19  6:26 AM  Result Value Ref Range   Sodium 137 135 - 145 mmol/L   Potassium 3.6 3.5 - 5.1 mmol/L   Chloride 100 98 - 111 mmol/L   CO2 27 22 - 32 mmol/L   Glucose, Bld 225 (H) 70 - 99 mg/dL    Comment: Glucose reference range applies only to samples taken after fasting for at least 8 hours.   BUN 9 6 - 20 mg/dL   Creatinine, Ser 8.85 0.44 - 1.00 mg/dL   Calcium 9.5 8.9 - 02.7 mg/dL   Total Protein 9.0 (H) 6.5 - 8.1 g/dL   Albumin 4.3 3.5 - 5.0 g/dL   AST 12 (L) 15 - 41 U/L   ALT 13 0 - 44 U/L   Alkaline Phosphatase 53 38 - 126 U/L   Total Bilirubin 0.5 0.3 - 1.2 mg/dL   GFR calc non Af Amer >60 >60 mL/min   GFR calc Af Amer >60 >60 mL/min   Anion gap 10 5 - 15    Comment: Performed at [facility], [address], [place], [place] [address]  Lipase, blood     Status: None   Collection Time: 08/20/19  6:26 AM  Result Value Ref Range   Lipase 21 11 - 51 U/L    Comment: Performed at [facility], [address], [place], [place] [address]  CBC with Differential     Status: Abnormal   Collection Time: 08/20/19  6:26 AM  Result Value Ref Range   WBC 5.3 4.0 - 10.5 K/uL   RBC 3.80 (L) 3.87 - 5.11 MIL/uL   Hemoglobin 11.6 (L) 12.0 - 15.0 g/dL   HCT 72.0 94.7 - 09.6 %   MCV 96.3 80.0 - 100.0 fL   MCH 30.5 26.0 - 34.0 pg   MCHC 31.7 30.0 - 36.0 g/dL   RDW 28.3 66.2 - 94.7 %   Platelets 324  150 - 400 K/uL   nRBC 0.0 0.0 - 0.2 %   Neutrophils Relative % 78 %   Neutro Abs 4.1 1.7 - 7.7 K/uL    Lymphocytes Relative 17 %   Lymphs Abs 0.9 0.7 - 4.0 K/uL   Monocytes Relative 5 %   Monocytes Absolute 0.3 0.1 - 1.0 K/uL   Eosinophils Relative 0 %   Eosinophils Absolute 0.0 0.0 - 0.5 K/uL   Basophils Relative 0 %   Basophils Absolute 0.0 0.0 - 0.1 K/uL   Immature Granulocytes 0 %   Abs Immature Granulocytes 0.01 0.00 - 0.07 K/uL    Comment: Performed at [facility], [address] [place], [place] [address]  Urinalysis, Routine w reflex microscopic     Status: Abnormal   Collection Time: 08/20/19 10:07 AM  Result Value Ref Range   Color, Urine YELLOW YELLOW   APPearance HAZY (A) CLEAR   Specific Gravity, Urine >1.046 (H) 1.005 - 1.030   pH 6.0 5.0 - 8.0   Glucose, UA NEGATIVE NEGATIVE mg/dL   Hgb urine dipstick NEGATIVE NEGATIVE   Bilirubin Urine NEGATIVE NEGATIVE   Ketones, ur NEGATIVE NEGATIVE mg/dL   Protein, ur NEGATIVE NEGATIVE mg/dL   Nitrite NEGATIVE NEGATIVE   Leukocytes,Ua NEGATIVE NEGATIVE    Comment: Performed at [facility], [address], [place], [place] [address]   Personally reviewed CT- distended gallbladder with stones, thickening wall and some fluid around gallbladder   DG Abdomen 1 View  Result Date: 08/20/2019 CLINICAL DATA:  No bowel movement for 1 week, mid abdominal pain for 15 days EXAM: ABDOMEN - 1 VIEW COMPARISON:  Acute abdominal series 08/06/2019 FINDINGS: No high-grade obstructive bowel gas pattern or large stool burden is seen. No suspicious calcifications over the gallbladder or renal shadows. Lung bases are clear. Inferior cardiomediastinal contours are unremarkable. Mild degenerative changes in the lower lumbar spine and pelvis. Stable appearance of a phlebolith in the right hemipelvis. Bones and soft tissues are otherwise unremarkable. IMPRESSION: No acute findings. No evidence of bowel obstruction or large stool burden. Electronically Signed   By: Lovena Le M.D.   On: 08/20/2019 06:02   CT Abdomen Pelvis W Contrast  Result Date:  08/20/2019 CLINICAL DATA:  Periumbilical abdominal pain for the past 2 weeks EXAM: CT ABDOMEN AND PELVIS WITH CONTRAST TECHNIQUE: Multidetector CT imaging of the abdomen and pelvis was performed using the standard protocol following bolus administration of intravenous contrast. CONTRAST:  138mL OMNIPAQUE IOHEXOL 300 MG/ML  SOLN COMPARISON:  None available FINDINGS: Lower chest:  No contributory findings. Hepatobiliary: No focal liver abnormality.Multiple calcified gallstones with gallbladder wall thickening and mild pericholecystic edema. No bile duct dilatation or visible choledocholithiasis. Pancreas: Unremarkable. Spleen: Unremarkable. Adrenals/Urinary Tract: Negative adrenals. No hydronephrosis or stone. Unremarkable bladder. Stomach/Bowel:  No obstruction. No appendicitis. Vascular/Lymphatic: No acute vascular abnormality. No mass or adenopathy. Reproductive:Small intramural fibroids ventrally and dorsally at the body, up to 2.5 cm. Other: No ascites or pneumoperitoneum. Musculoskeletal: No acute abnormalities. IMPRESSION: 1. Cholelithiasis and possible cholecystitis. 2. Intramural fibroids. Electronically Signed   By: Monte Fantasia M.D.   On: 08/20/2019 07:49     Assessment & Plan:  Karlei Waldo is a 59 y.o. female with cholelithiasis on imaging and findings and history concerning for some degree of cholecystitis. She looks very uncomfortable today.  She is not taking in a lot of liquids or food.   -OR for laparoscopic cholecystectomy tomorrow -Preoperative labs done in the ED, and COVID rapid in [place]  PLAN: I counseled the patient about the indication, risks and benefits of laparoscopic cholecystectomy.  She understands there is a very small chance for bleeding, infection, injury to normal structures (including common bile duct), conversion to open surgery, persistent symptoms, evolution of postcholecystectomy diarrhea, need for secondary interventions, anesthesia reaction, cardiopulmonary issues  and other risks not specifically detailed here. I described the expected recovery, the plan for follow-up and the restrictions during the recovery phase.  All questions were answered.   All questions were answered to the satisfaction of the patient.   Deshan Hemmelgarn C Rakhi Romagnoli 08/20/2019, 12:05 PM       

## 2019-08-20 NOTE — Patient Instructions (Signed)
Cholelithiasis  Cholelithiasis is a form of gallbladder disease in which gallstones form in the gallbladder. The gallbladder is an organ that stores bile. Bile is made in the liver, and it helps to digest fats. Gallstones begin as small crystals and slowly grow into stones. They may cause no symptoms until the gallbladder tightens (contracts) and a gallstone is blocking the duct (gallbladder attack), which can cause pain. Cholelithiasis is also referred to as gallstones. There are two main types of gallstones:  Cholesterol stones. These are made of hardened cholesterol and are usually yellow-green in color. They are the most common type of gallstone. Cholesterol is a white, waxy, fat-like substance that is made in the liver.  Pigment stones. These are dark in color and are made of a red-yellow substance that forms when hemoglobin from red blood cells breaks down (bilirubin). What are the causes? This condition may be caused by an imbalance in the substances that bile is made of. This can happen if the bile:  Has too much bilirubin.  Has too much cholesterol.  Does not have enough bile salts. These salts help the body absorb and digest fats. In some cases, this condition can also be caused by the gallbladder not emptying completely or often enough. What increases the risk? The following factors may make you more likely to develop this condition:  Being female.  Having multiple pregnancies. Health care providers sometimes advise removing diseased gallbladders before future pregnancies.  Eating a diet that is heavy in fried foods, fat, and refined carbohydrates, like white bread and white rice.  Being obese.  Being older than age 40.  Prolonged use of medicines that contain female hormones (estrogen).  Having diabetes mellitus.  Rapidly losing weight.  Having a family history of gallstones.  Being of American Indian or Mexican descent.  Having an intestinal disease such as Crohn  disease.  Having metabolic syndrome.  Having cirrhosis.  Having severe types of anemia such as sickle cell anemia. What are the signs or symptoms? In most cases, there are no symptoms. These are known as silent gallstones. If a gallstone blocks the bile ducts, it can cause a gallbladder attack. The main symptom of a gallbladder attack is sudden pain in the upper right abdomen. The pain usually comes at night or after eating a large meal. The pain can last for one or several hours and can spread to the right shoulder or chest. If the bile duct is blocked for more than a few hours, it can cause infection or inflammation of the gallbladder, liver, or pancreas, which may cause:  Nausea.  Vomiting.  Abdominal pain that lasts for 5 hours or more.  Fever or chills.  Yellowing of the skin or the whites of the eyes (jaundice).  Dark urine.  Light-colored stools. How is this diagnosed? This condition may be diagnosed based on:  A physical exam.  Your medical history.  An ultrasound of your gallbladder.  CT scan.  MRI.  Blood tests to check for signs of infection or inflammation.  A scan of your gallbladder and bile ducts (biliary system) using nonharmful radioactive material and special cameras that can see the radioactive material (cholescintigram). This test checks to see how your gallbladder contracts and whether bile ducts are blocked.  Inserting a small tube with a camera on the end (endoscope) through your mouth to inspect bile ducts and check for blockages (endoscopic retrograde cholangiopancreatogram). How is this treated? Treatment for gallstones depends on the severity of the condition.   Silent gallstones do not need treatment. If the gallstones cause a gallbladder attack or other symptoms, treatment may be required. Options for treatment include:  Surgery to remove the gallbladder (cholecystectomy). This is the most common treatment.  Medicines to dissolve gallstones.  These are most effective at treating small gallstones. You may need to take medicines for up to 6-12 months.  Shock wave treatment (extracorporeal biliary lithotripsy). In this treatment, an ultrasound machine sends shock waves to the gallbladder to break gallstones into smaller pieces. These pieces can then be passed into the intestines or be dissolved by medicine. This is rarely used.  Removing gallstones through endoscopic retrograde cholangiopancreatogram. A small basket can be attached to the endoscope and used to capture and remove gallstones. Follow these instructions at home:  Take over-the-counter and prescription medicines only as told by your health care provider.  Maintain a healthy weight and follow a healthy diet. This includes: ? Reducing fatty foods, such as fried food. ? Reducing refined carbohydrates, like white bread and white rice. ? Increasing fiber. Aim for foods like almonds, fruit, and beans.  Keep all follow-up visits as told by your health care provider. This is important. Contact a health care provider if:  You think you have had a gallbladder attack.  You have been diagnosed with silent gallstones and you develop abdominal pain or indigestion. Get help right away if:  You have pain from a gallbladder attack that lasts for more than 2 hours.  You have abdominal pain that lasts for more than 5 hours.  You have a fever or chills.  You have persistent nausea and vomiting.  You develop jaundice.  You have dark urine or light-colored stools. Summary  Cholelithiasis (also called gallstones) is a form of gallbladder disease in which gallstones form in the gallbladder.  This condition is caused by an imbalance in the substances that make up bile. This can happen if the bile has too much cholesterol, too much bilirubin, or not enough bile salts.  You are more likely to develop this condition if you are female, pregnant, using medicines with estrogen, obese,  older than age 40, or have a family history of gallstones. You may also develop gallstones if you have diabetes, an intestinal disease, cirrhosis, or metabolic syndrome.  Treatment for gallstones depends on the severity of the condition. Silent gallstones do not need treatment.  If gallstones cause a gallbladder attack or other symptoms, treatment may be needed. The most common treatment is surgery to remove the gallbladder. This information is not intended to replace advice given to you by your health care provider. Make sure you discuss any questions you have with your health care provider. Document Revised: 05/03/2017 Document Reviewed: 02/05/2016 Elsevier Patient Education  2020 Else[employer] Inc.   Laparoscopic Cholecystectomy Laparoscopic cholecystectomy is surgery to remove the gallbladder. The gallbladder is a pear-shaped organ that lies beneath the liver on the right side of the body. The gallbladder stores bile, which is a fluid that helps the body to digest fats. Cholecystectomy is often done for inflammation of the gallbladder (cholecystitis). This condition is usually caused by a buildup of gallstones (cholelithiasis) in the gallbladder. Gallstones can block the flow of bile, which can result in inflammation and pain. In severe cases, emergency surgery may be required. This procedure is done though small incisions in your abdomen (laparoscopic surgery). A thin scope with a camera (laparoscope) is inserted through one incision. Thin surgical instruments are inserted through the other incisions. In some cases,   a laparoscopic procedure may be turned into a type of surgery that is done through a larger incision (open surgery). Tell a health care provider about:  Any allergies you have.  All medicines you are taking, including vitamins, herbs, eye drops, creams, and over-the-counter medicines.  Any problems you or family members have had with anesthetic medicines.  Any blood disorders you  have.  Any surgeries you have had.  Any medical conditions you have.  Whether you are pregnant or may be pregnant. What are the risks? Generally, this is a safe procedure. However, problems may occur, including:  Infection.  Bleeding.  Allergic reactions to medicines.  Damage to other structures or organs.  A stone remaining in the common bile duct. The common bile duct carries bile from the gallbladder into the small intestine.  A bile leak from the cyst duct that is clipped when your gallbladder is removed. What happens before the procedure? Medicines  Ask your health care provider about: ? Changing or stopping your regular medicines. This is especially important if you are taking diabetes medicines or blood thinners. ? Taking medicines such as aspirin and ibuprofen. These medicines can thin your blood. Do not take these medicines before your procedure if your health care provider instructs you not to.  You may be given antibiotic medicine to help prevent infection. General instructions  Let your health care provider know if you develop a cold or an infection before surgery.  Plan to have someone take you home from the hospital or clinic.  Ask your health care provider how your surgical site will be marked or identified. What happens during the procedure?   To reduce your risk of infection: ? Your health care team will wash or sanitize their hands. ? Your skin will be washed with soap. ? Hair may be removed from the surgical area.  An IV tube may be inserted into one of your veins.  You will be given one or more of the following: ? A medicine to help you relax (sedative). ? A medicine to make you fall asleep (general anesthetic).  A breathing tube will be placed in your mouth.  Your surgeon will make several small cuts (incisions) in your abdomen.  The laparoscope will be inserted through one of the small incisions. The camera on the laparoscope will send  images to a TV screen (monitor) in the operating room. This lets your surgeon see inside your abdomen.  Air-like gas will be pumped into your abdomen. This will expand your abdomen to give the surgeon more room to perform the surgery.  Other tools that are needed for the procedure will be inserted through the other incisions. The gallbladder will be removed through one of the incisions.  Your common bile duct may be examined. If stones are found in the common bile duct, they may be removed.  After your gallbladder has been removed, the incisions will be closed with stitches (sutures), staples, or skin glue.  Your incisions may be covered with a bandage (dressing). The procedure may vary among health care providers and hospitals. What happens after the procedure?  Your blood pressure, heart rate, breathing rate, and blood oxygen level will be monitored until the medicines you were given have worn off.  You will be given medicines as needed to control your pain.  Do not drive for 24 hours if you were given a sedative. This information is not intended to replace advice given to you by your health care provider. Make  sure you discuss any questions you have with your health care provider. Document Revised: 05/03/2017 Document Reviewed: 11/07/2015 Elsevier Patient Education  [place].

## 2019-08-20 NOTE — ED Triage Notes (Signed)
Pt reports mid abdominal pain that started beginning of march, pt was seen here on 08/06/19 for the same. Pt also has vomited several times since yesterday, says this is a new sx, as last time seen she had only nausea. Pt reports feeling bloating, belching, and gas. Reports she has not taken TUMS or anything for indigestion, for fear of interaction with Bentyl.

## 2019-08-20 NOTE — Discharge Instructions (Signed)
You have gallstones.  Will need to see a general surgeon.  Phone number given.  She can actually see you today at [phone].  Prescription for pain medicine, nausea medicine, GERD medicine.

## 2019-08-20 NOTE — ED Provider Notes (Signed)
0945: Recheck.  No acute abdomen.  Discussed CT results including cholelithiasis.  White count normal.  Liver functions normal.  Discussed with general surgeon on-call Dr. Henreitta Leber.  Will discharge home with pain medicine, nausea medicine, GERD medicine   Donnetta Hutching, MD 08/20/19 1005

## 2019-08-20 NOTE — ED Provider Notes (Signed)
[facility] EMERGENCY DEPARTMENT Provider Note   CSN: [id number] Arrival date & time: 08/20/19  0453   Time seen 6:00 AM  History Chief Complaint  Patient presents with  . Abdominal Pain    Kiara Kent is a 59 y.o. female.  HPI   Patient states she was seen in the ED about 2 weeks ago, March 4 when she had not had a bowel movement for a week and she was having periumbilical abdominal pain.  She also reports she was seen last week by her primary care doctor who felt she was having constipation, IBS, and GERD.  She has a follow-up appointment on the 23rd with her primary care doctor.  She states she did not have any pain for about 3 or 4 days however it returned on 317.  She states she this time has nausea and vomited about 6 times since the pain started.  She denies diarrhea and states she did have a bowel movement 2 days ago.  She states her primary care doctor did start her on Linzess which she takes 3 times a week to have a BM.  She states her pain is constant and aching.  Nothing she does makes it worse, vomiting makes it feel better for about 20 minutes then it returns.  She denies that her bowel movements are hard or like balls.  She denies fever.  She states she has had similar pain in the past with her GERD which would be relieved with Tums however the Tums do not relieve her pain now.  She states she is never been on any medication for her GERD air or has never seen a gastroenterologist.  PCP Rebecka Apley, NP   Past Medical History:  Diagnosis Date  . Anxiety   . Diabetes mellitus without complication (HCC)   . Hypercholesterolemia   . Hypertension     There are no problems to display for this patient.   Past Surgical History:  Procedure Laterality Date  . TUBAL LIGATION       OB History   No obstetric history on file.     No family history on file.  Social History   Tobacco Use  . Smoking status: Never Smoker  . Smokeless tobacco: Never Used    Substance Use Topics  . Alcohol use: Never  . Drug use: Never    Home Medications Prior to Admission medications   Medication Sig Start Date End Date Taking? Authorizing Provider  amLODipine (NORVASC) 5 MG tablet Take 5 mg by mouth daily.    [provider]  buPROPion (WELLBUTRIN XL) 150 MG 24 hr tablet Take 150 mg by mouth every morning. 07/27/19   [provider]  dicyclomine (BENTYL) 20 MG tablet Take 1 tablet (20 mg total) by mouth 2 (two) times daily. 08/06/19   Donnetta Hutching, MD  lisinopril (ZESTRIL) 20 MG tablet Take 20 mg by mouth daily.    [provider]  metFORMIN (GLUCOPHAGE) 500 MG tablet Take 500 mg by mouth 2 (two) times daily. 06/06/19   [provider]  simvastatin (ZOCOR) 40 MG tablet Take 40 mg by mouth daily.    [provider]  linzess   Allergies    Patient has no known allergies.  Review of Systems   Review of Systems  All other systems reviewed and are negative.   Physical Exam Updated Vital Signs BP (!) 140/92   Pulse 93   Temp 98.1 F (36.7 C) (Oral)   Resp  18   SpO2 97%   Physical Exam Vitals and nursing note reviewed.  Constitutional:      General: She is not in acute distress.    Appearance: She is well-developed. She is obese.  HENT:     Head: Normocephalic and atraumatic.     Right Ear: External ear normal.     Left Ear: External ear normal.     Nose: Nose normal.     Mouth/Throat:     Mouth: Mucous membranes are dry.  Eyes:     Extraocular Movements: Extraocular movements intact.     Conjunctiva/sclera: Conjunctivae normal.     Pupils: Pupils are equal, round, and reactive to light.  Cardiovascular:     Rate and Rhythm: Normal rate and regular rhythm.  Pulmonary:     Effort: Pulmonary effort is normal. No respiratory distress.  Abdominal:     General: Abdomen is flat. Bowel sounds are normal.     Palpations: Abdomen is soft.     Tenderness: There is abdominal tenderness. There is no  guarding or rebound.    Musculoskeletal:        General: Normal range of motion.     Cervical back: Normal range of motion.  Skin:    General: Skin is warm and dry.  Neurological:     General: No focal deficit present.     Mental Status: She is alert and oriented to person, place, and time.     Cranial Nerves: No cranial nerve deficit.  Psychiatric:        Mood and Affect: Mood normal.        Behavior: Behavior normal.        Thought Content: Thought content normal.     ED Results / Procedures / Treatments   Labs (all labs ordered are listed, but only abnormal results are displayed) Results for orders placed or performed during the hospital encounter of 08/20/19  Comprehensive metabolic panel  Result Value Ref Range   Sodium 137 135 - 145 mmol/L   Potassium 3.6 3.5 - 5.1 mmol/L   Chloride 100 98 - 111 mmol/L   CO2 27 22 - 32 mmol/L   Glucose, Bld 225 (H) 70 - 99 mg/dL   BUN 9 6 - 20 mg/dL   Creatinine, Ser 6.78 0.44 - 1.00 mg/dL   Calcium 9.5 8.9 - 93.8 mg/dL   Total Protein 9.0 (H) 6.5 - 8.1 g/dL   Albumin 4.3 3.5 - 5.0 g/dL   AST 12 (L) 15 - 41 U/L   ALT 13 0 - 44 U/L   Alkaline Phosphatase 53 38 - 126 U/L   Total Bilirubin 0.5 0.3 - 1.2 mg/dL   GFR calc non Af Amer >60 >60 mL/min   GFR calc Af Amer >60 >60 mL/min   Anion gap 10 5 - 15  Lipase, blood  Result Value Ref Range   Lipase 21 11 - 51 U/L  CBC with Differential  Result Value Ref Range   WBC 5.3 4.0 - 10.5 K/uL   RBC 3.80 (L) 3.87 - 5.11 MIL/uL   Hemoglobin 11.6 (L) 12.0 - 15.0 g/dL   HCT 10.1 75.1 - 02.5 %   MCV 96.3 80.0 - 100.0 fL   MCH 30.5 26.0 - 34.0 pg   MCHC 31.7 30.0 - 36.0 g/dL   RDW 85.2 77.8 - 24.2 %   Platelets 324 150 - 400 K/uL   nRBC 0.0 0.0 - 0.2 %   Neutrophils Relative % 78 %  Neutro Abs 4.1 1.7 - 7.7 K/uL   Lymphocytes Relative 17 %   Lymphs Abs 0.9 0.7 - 4.0 K/uL   Monocytes Relative 5 %   Monocytes Absolute 0.3 0.1 - 1.0 K/uL   Eosinophils Relative 0 %   Eosinophils  Absolute 0.0 0.0 - 0.5 K/uL   Basophils Relative 0 %   Basophils Absolute 0.0 0.0 - 0.1 K/uL   Immature Granulocytes 0 %   Abs Immature Granulocytes 0.01 0.00 - 0.07 K/uL   Laboratory interpretation all normal except mild anemia    EKG None  Radiology DG Abdomen 1 View  Result Date: 08/20/2019 CLINICAL DATA:  No bowel movement for 1 week, mid abdominal pain for 15 days EXAM: ABDOMEN - 1 VIEW COMPARISON:  Acute abdominal series 08/06/2019 FINDINGS: No high-grade obstructive bowel gas pattern or large stool burden is seen. No suspicious calcifications over the gallbladder or renal shadows. Lung bases are clear. Inferior cardiomediastinal contours are unremarkable. Mild degenerative changes in the lower lumbar spine and pelvis. Stable appearance of a phlebolith in the right hemipelvis. Bones and soft tissues are otherwise unremarkable. IMPRESSION: No acute findings. No evidence of bowel obstruction or large stool burden. Electronically Signed   By: Kreg Shropshire M.D.   On: 08/20/2019 06:02   CT Abdomen Pelvis W Contrast  Result Date: 08/20/2019 CLINICAL DATA:  Periumbilical abdominal pain for the past 2 weeks EXAM: CT ABDOMEN AND PELVIS WITH CONTRAST TECHNIQUE: Multidetector CT imaging of the abdomen and pelvis was performed using the standard protocol following bolus administration of intravenous contrast. CONTRAST:  OMNIPAQUE IOHEXOL 300 MG/ML  SOLN COMPARISON:  None available FINDINGS: Lower chest:  No contributory findings. Hepatobiliary: No focal liver abnormality.Multiple calcified gallstones with gallbladder wall thickening and mild pericholecystic edema. No bile duct dilatation or visible choledocholithiasis. Pancreas: Unremarkable. Spleen: Unremarkable. Adrenals/Urinary Tract: Negative adrenals. No hydronephrosis or stone. Unremarkable bladder. Stomach/Bowel:  No obstruction. No appendicitis. Vascular/Lymphatic: No acute vascular abnormality. No mass or adenopathy. Reproductive:Small  intramural fibroids ventrally and dorsally at the body, up to 2.5 cm. Other: No ascites or pneumoperitoneum. Musculoskeletal: No acute abnormalities. IMPRESSION: 1. Cholelithiasis and possible cholecystitis. 2. Intramural fibroids. Electronically Signed   By: Marnee Spring M.D.   On: 08/20/2019 07:49    Procedures Procedures (including critical care time)  Medications Ordered in ED Medications  sodium chloride 0.9 % bolus 1,000 mL (1,000 mLs Intravenous New Bag/Given 08/20/19 0704)  fentaNYL (SUBLIMAZE) injection 50 mcg (50 mcg Intravenous Given 08/20/19 0657)  ondansetron (ZOFRAN) injection 4 mg (4 mg Intravenous Given 08/20/19 0658)  iohexol (OMNIPAQUE) 300 MG/ML solution 100 mL (100 mLs Intravenous Contrast Given 08/20/19 4098)    ED Course  I have reviewed the triage vital signs and the nursing notes.  Pertinent labs & imaging results that were available during my care of the patient were reviewed by me and considered in my medical decision making (see chart for details).    MDM Rules/Calculators/A&P                      Patient was given IV fluids and IV pain and nausea medication.  Laboratory testing was done.  CT of the abdomen pelvis was done.  When I review her prior films she has never had a CT of her abdomen/pelvis.  Patient was turned over to Dr. Adriana Simas at 7:55 AM, he will arrange for antibiotics and admission.  Final Clinical Impression(s) / ED Diagnoses Final diagnoses:  Periumbilical abdominal pain  Cholecystitis  Rx / DC Orders  Disposition pending  Rolland Porter, MD, Barbette Or, MD 08/20/19 [id number]

## 2019-08-21 ENCOUNTER — Encounter (HOSPITAL_COMMUNITY): Payer: Self-pay | Admitting: General Surgery

## 2019-08-21 ENCOUNTER — Ambulatory Visit (HOSPITAL_COMMUNITY): Payer: BC Managed Care – PPO | Admitting: Anesthesiology

## 2019-08-21 ENCOUNTER — Ambulatory Visit (HOSPITAL_COMMUNITY)
Admission: RE | Admit: 2019-08-21 | Discharge: 2019-08-21 | Disposition: A | Discharge status: Home / Self Care | Payer: BC Managed Care – PPO | Attending: General Surgery | Admitting: General Surgery

## 2019-08-21 ENCOUNTER — Encounter (HOSPITAL_COMMUNITY)
Admission: RE | Disposition: A | Discharge status: Home / Self Care | Payer: Self-pay | Source: Home / Self Care | Attending: General Surgery

## 2019-08-21 DIAGNOSIS — F419 Anxiety disorder, unspecified: Secondary | ICD-10-CM | POA: Diagnosis not present

## 2019-08-21 DIAGNOSIS — K589 Irritable bowel syndrome without diarrhea: Secondary | ICD-10-CM | POA: Insufficient documentation

## 2019-08-21 DIAGNOSIS — K219 Gastro-esophageal reflux disease without esophagitis: Secondary | ICD-10-CM | POA: Diagnosis not present

## 2019-08-21 DIAGNOSIS — E7800 Pure hypercholesterolemia, unspecified: Secondary | ICD-10-CM | POA: Insufficient documentation

## 2019-08-21 DIAGNOSIS — Z79899 Other long term (current) drug therapy: Secondary | ICD-10-CM | POA: Insufficient documentation

## 2019-08-21 DIAGNOSIS — E119 Type 2 diabetes mellitus without complications: Secondary | ICD-10-CM | POA: Diagnosis not present

## 2019-08-21 DIAGNOSIS — I10 Essential (primary) hypertension: Secondary | ICD-10-CM | POA: Insufficient documentation

## 2019-08-21 DIAGNOSIS — Z20822 Contact with and (suspected) exposure to covid-19: Secondary | ICD-10-CM | POA: Insufficient documentation

## 2019-08-21 DIAGNOSIS — K8012 Calculus of gallbladder with acute and chronic cholecystitis without obstruction: Secondary | ICD-10-CM

## 2019-08-21 HISTORY — PX: CHOLECYSTECTOMY: SHX55

## 2019-08-21 LAB — RESPIRATORY PANEL BY RT PCR (FLU A&B, COVID)
Influenza A by PCR: NEGATIVE
Influenza B by PCR: NEGATIVE
SARS Coronavirus 2 by RT PCR: NEGATIVE

## 2019-08-21 LAB — GLUCOSE, CAPILLARY
Glucose-Capillary: 155 mg/dL — ABNORMAL HIGH (ref 70–99)
Glucose-Capillary: 199 mg/dL — ABNORMAL HIGH (ref 70–99)

## 2019-08-21 SURGERY — LAPAROSCOPIC CHOLECYSTECTOMY
Anesthesia: General

## 2019-08-21 MED ORDER — ESMOLOL HCL 100 MG/10ML IV SOLN
INTRAVENOUS | Status: AC
  Filled 2019-08-21: qty 10

## 2019-08-21 MED ORDER — PROPOFOL 10 MG/ML IV BOLUS
INTRAVENOUS | Status: DC | PRN
  Administered 2019-08-21: 150 mg via INTRAVENOUS

## 2019-08-21 MED ORDER — HYDROMORPHONE HCL 1 MG/ML IJ SOLN: 0.2500 mg | INTRAMUSCULAR | Status: DC | PRN

## 2019-08-21 MED ORDER — LACTATED RINGERS IV SOLN: Freq: Once | INTRAVENOUS | Status: AC

## 2019-08-21 MED ORDER — PHENYLEPHRINE 40 MCG/ML (10ML) SYRINGE FOR IV PUSH (FOR BLOOD PRESSURE SUPPORT)
PREFILLED_SYRINGE | INTRAVENOUS | Status: AC
  Filled 2019-08-21: qty 10

## 2019-08-21 MED ORDER — ONDANSETRON HCL 4 MG/2ML IJ SOLN
INTRAMUSCULAR | Status: DC | PRN
  Administered 2019-08-21: 4 mg via INTRAVENOUS

## 2019-08-21 MED ORDER — SODIUM CHLORIDE 0.9 % IR SOLN
Status: DC | PRN
  Administered 2019-08-21: 1000 mL

## 2019-08-21 MED ORDER — CHLORHEXIDINE GLUCONATE CLOTH 2 % EX PADS: 6.0000 | MEDICATED_PAD | Freq: Once | CUTANEOUS | Status: DC

## 2019-08-21 MED ORDER — PHENYLEPHRINE 40 MCG/ML (10ML) SYRINGE FOR IV PUSH (FOR BLOOD PRESSURE SUPPORT)
PREFILLED_SYRINGE | INTRAVENOUS | Status: DC | PRN
  Administered 2019-08-21: 120 ug via INTRAVENOUS
  Administered 2019-08-21 (×2): 40 ug via INTRAVENOUS
  Administered 2019-08-21: 80 ug via INTRAVENOUS
  Administered 2019-08-21: 120 ug via INTRAVENOUS

## 2019-08-21 MED ORDER — MEPERIDINE HCL 50 MG/ML IJ SOLN: 6.2500 mg | INTRAMUSCULAR | Status: DC | PRN

## 2019-08-21 MED ORDER — DEXAMETHASONE SODIUM PHOSPHATE 10 MG/ML IJ SOLN
INTRAMUSCULAR | Status: AC
  Filled 2019-08-21: qty 1

## 2019-08-21 MED ORDER — EPHEDRINE 5 MG/ML INJ
INTRAVENOUS | Status: AC
  Filled 2019-08-21: qty 10

## 2019-08-21 MED ORDER — FENTANYL CITRATE (PF) 250 MCG/5ML IJ SOLN
INTRAMUSCULAR | Status: AC
  Filled 2019-08-21: qty 5

## 2019-08-21 MED ORDER — ROCURONIUM BROMIDE 10 MG/ML (PF) SYRINGE
PREFILLED_SYRINGE | INTRAVENOUS | Status: AC
  Filled 2019-08-21: qty 10

## 2019-08-21 MED ORDER — LIDOCAINE 2% (20 MG/ML) 5 ML SYRINGE
INTRAMUSCULAR | Status: AC
  Filled 2019-08-21: qty 5

## 2019-08-21 MED ORDER — HEMOSTATIC AGENTS (NO CHARGE) OPTIME
TOPICAL | Status: DC | PRN
  Administered 2019-08-21: 1 via TOPICAL

## 2019-08-21 MED ORDER — MIDAZOLAM HCL 2 MG/2ML IJ SOLN
2.0000 mg | Freq: Once | INTRAMUSCULAR | Status: AC
  Administered 2019-08-21: 08:00:00 2 mg via INTRAVENOUS
  Filled 2019-08-21: qty 2

## 2019-08-21 MED ORDER — DOCUSATE SODIUM 100 MG PO CAPS: 100.0000 mg | ORAL_CAPSULE | Freq: Two times a day (BID) | ORAL | 2 refills | Status: AC

## 2019-08-21 MED ORDER — LIDOCAINE 2% (20 MG/ML) 5 ML SYRINGE
INTRAMUSCULAR | Status: DC | PRN
  Administered 2019-08-21: 60 mg via INTRAVENOUS

## 2019-08-21 MED ORDER — PROMETHAZINE HCL 25 MG/ML IJ SOLN: 6.2500 mg | INTRAMUSCULAR | Status: DC | PRN

## 2019-08-21 MED ORDER — ARTIFICIAL TEARS OPHTHALMIC OINT
TOPICAL_OINTMENT | OPHTHALMIC | Status: AC
  Filled 2019-08-21: qty 3.5

## 2019-08-21 MED ORDER — BUPIVACAINE HCL (PF) 0.5 % IJ SOLN
INTRAMUSCULAR | Status: AC
  Filled 2019-08-21: qty 30

## 2019-08-21 MED ORDER — MIDAZOLAM HCL 2 MG/2ML IJ SOLN: INTRAMUSCULAR | Status: DC | PRN

## 2019-08-21 MED ORDER — MIDAZOLAM HCL 2 MG/2ML IJ SOLN
INTRAMUSCULAR | Status: AC
  Filled 2019-08-21: qty 2

## 2019-08-21 MED ORDER — OXYCODONE HCL 5 MG PO TABS: 5.0000 mg | ORAL_TABLET | ORAL | 0 refills | Status: AC | PRN

## 2019-08-21 MED ORDER — DEXAMETHASONE SODIUM PHOSPHATE 10 MG/ML IJ SOLN
INTRAMUSCULAR | Status: DC | PRN
  Administered 2019-08-21: 10 mg via INTRAVENOUS

## 2019-08-21 MED ORDER — SODIUM CHLORIDE 0.9 % IV SOLN
2.0000 g | INTRAVENOUS | Status: AC
  Administered 2019-08-21: 2 g via INTRAVENOUS
  Filled 2019-08-21: qty 2

## 2019-08-21 MED ORDER — EPHEDRINE SULFATE-NACL 50-0.9 MG/10ML-% IV SOSY
PREFILLED_SYRINGE | INTRAVENOUS | Status: DC | PRN
  Administered 2019-08-21 (×2): 10 mg via INTRAVENOUS

## 2019-08-21 MED ORDER — BUPIVACAINE HCL (PF) 0.5 % IJ SOLN
INTRAMUSCULAR | Status: DC | PRN
  Administered 2019-08-21: 20 mL

## 2019-08-21 MED ORDER — SUGAMMADEX SODIUM 200 MG/2ML IV SOLN
INTRAVENOUS | Status: DC | PRN
  Administered 2019-08-21: 200 mg via INTRAVENOUS

## 2019-08-21 MED ORDER — ONDANSETRON HCL 4 MG/2ML IJ SOLN
INTRAMUSCULAR | Status: AC
  Filled 2019-08-21: qty 2

## 2019-08-21 MED ORDER — ESMOLOL HCL 100 MG/10ML IV SOLN
INTRAVENOUS | Status: DC | PRN
  Administered 2019-08-21: 10 mg via INTRAVENOUS

## 2019-08-21 MED ORDER — ROCURONIUM BROMIDE 10 MG/ML (PF) SYRINGE
PREFILLED_SYRINGE | INTRAVENOUS | Status: DC | PRN
  Administered 2019-08-21: 50 mg via INTRAVENOUS

## 2019-08-21 MED ORDER — FENTANYL CITRATE (PF) 250 MCG/5ML IJ SOLN
INTRAMUSCULAR | Status: DC | PRN
  Administered 2019-08-21 (×2): 50 ug via INTRAVENOUS
  Administered 2019-08-21: 100 ug via INTRAVENOUS
  Administered 2019-08-21: 50 ug via INTRAVENOUS

## 2019-08-21 MED ORDER — LACTATED RINGERS IV SOLN: INTRAVENOUS | Status: DC | PRN

## 2019-08-21 SURGICAL SUPPLY — 49 items
ADH SKN CLS APL DERMABOND .7 (GAUZE/BANDAGES/DRESSINGS) ×1
APL PRP STRL LF DISP 70% ISPRP (MISCELLANEOUS) ×1
APPLIER CLIP ROT 10 11.4 M/L (STAPLE) ×3
APR CLP MED LRG 11.4X10 (STAPLE) ×1
BAG RETRIEVAL 10 (BASKET) ×1
BAG RETRIEVAL 10MM (BASKET) ×1
BLADE SURG 15 STRL LF DISP TIS (BLADE) ×1 IMPLANT
BLADE SURG 15 STRL SS (BLADE) ×3
CHLORAPREP W/TINT 26 (MISCELLANEOUS) ×3 IMPLANT
CLIP APPLIE ROT 10 11.4 M/L (STAPLE) ×1 IMPLANT
CLOTH BEACON ORANGE TIMEOUT ST (SAFETY) ×3 IMPLANT
COVER LIGHT HANDLE STERIS (MISCELLANEOUS) ×6 IMPLANT
COVER WAND RF STERILE (DRAPES) ×3 IMPLANT
DECANTER SPIKE VIAL GLASS SM (MISCELLANEOUS) ×3 IMPLANT
DERMABOND ADVANCED (GAUZE/BANDAGES/DRESSINGS) ×2
DERMABOND ADVANCED .7 DNX12 (GAUZE/BANDAGES/DRESSINGS) ×1 IMPLANT
ELECT REM PT RETURN 9FT ADLT (ELECTROSURGICAL) ×3
ELECTRODE REM PT RTRN 9FT ADLT (ELECTROSURGICAL) ×1 IMPLANT
GLOVE BIO SURGEON STRL SZ 6.5 (GLOVE) ×2 IMPLANT
GLOVE BIO SURGEONS STRL SZ 6.5 (GLOVE) ×1
GLOVE BIOGEL PI IND STRL 6.5 (GLOVE) ×1 IMPLANT
GLOVE BIOGEL PI IND STRL 7.0 (GLOVE) ×3 IMPLANT
GLOVE BIOGEL PI INDICATOR 6.5 (GLOVE) ×4
GLOVE BIOGEL PI INDICATOR 7.0 (GLOVE) ×8
GLOVE ECLIPSE 6.5 STRL STRAW (GLOVE) ×6 IMPLANT
GLOVE ECLIPSE 7.0 STRL STRAW (GLOVE) ×4 IMPLANT
GOWN STRL REUS W/TWL LRG LVL3 (GOWN DISPOSABLE) ×9 IMPLANT
HEMOSTAT SNOW SURGICEL 2X4 (HEMOSTASIS) ×3 IMPLANT
INST SET LAPROSCOPIC AP (KITS) ×3 IMPLANT
KIT TURNOVER KIT A (KITS) ×3 IMPLANT
MANIFOLD NEPTUNE II (INSTRUMENTS) ×3 IMPLANT
NDL INSUFFLATION 14GA 120MM (NEEDLE) ×1 IMPLANT
NEEDLE INSUFFLATION 14GA 120MM (NEEDLE) ×3 IMPLANT
NS IRRIG 1000ML POUR BTL (IV SOLUTION) ×3 IMPLANT
PACK LAP CHOLE LZT030E (CUSTOM PROCEDURE TRAY) ×3 IMPLANT
PAD ARMBOARD 7.5X6 YLW CONV (MISCELLANEOUS) ×3 IMPLANT
SET BASIN LINEN APH (SET/KITS/TRAYS/PACK) ×3 IMPLANT
SET TUBE SMOKE EVAC HIGH FLOW (TUBING) ×3 IMPLANT
SLEEVE ENDOPATH XCEL 5M (ENDOMECHANICALS) ×3 IMPLANT
SUT MNCRL AB 4-0 PS2 18 (SUTURE) ×3 IMPLANT
SUT VICRYL 0 UR6 27IN ABS (SUTURE) ×3 IMPLANT
SYS BAG RETRIEVAL 10MM (BASKET) ×1
SYSTEM BAG RETRIEVAL 10MM (BASKET) ×1 IMPLANT
TROCAR ENDO BLADELESS 11MM (ENDOMECHANICALS) ×3 IMPLANT
TROCAR XCEL NON-BLD 5MMX100MML (ENDOMECHANICALS) ×3 IMPLANT
TROCAR XCEL UNIV SLVE 11M 100M (ENDOMECHANICALS) ×3 IMPLANT
TUBE CONNECTING 12'X1/4 (SUCTIONS) ×1
TUBE CONNECTING 12X1/4 (SUCTIONS) ×2 IMPLANT
WARMER LAPAROSCOPE (MISCELLANEOUS) ×3 IMPLANT

## 2019-08-21 NOTE — Progress Notes (Signed)
[facility]  Spoke with Romero Belling, Friend, surgery completed. Rx sent to pharmacy. Plan for a post operative phone call 4/6. 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  If she needs further time out she will let our office know.  Algis Greenhouse, MD [facility] [address] [place], [place] [address] [phone] (office)

## 2019-08-21 NOTE — Anesthesia Postprocedure Evaluation (Signed)
Anesthesia Post Note  Patient: Kiara Kent  Procedure(s) Performed: LAPAROSCOPIC CHOLECYSTECTOMY (N/A )  Patient location during evaluation: PACU Anesthesia Type: General Level of consciousness: awake and alert and oriented Pain management: pain level controlled Vital Signs Assessment: post-procedure vital signs reviewed and stable Respiratory status: spontaneous breathing and respiratory function stable Cardiovascular status: blood pressure returned to baseline Postop Assessment: no apparent nausea or vomiting Anesthetic complications: no     Last Vitals:  Vitals:   08/21/19 1124 08/21/19 1145  BP: 122/79 116/70  Pulse: (!) 108 100  Resp: 19 16  Temp:  36.9 C  SpO2: 95% 96%    Last Pain:  Vitals:   08/21/19 1145  TempSrc: Oral  PainSc: 4                  Breannah Kratt C Azzan Butler

## 2019-08-21 NOTE — Anesthesia Preprocedure Evaluation (Signed)
Anesthesia Evaluation  Patient identified by MRN, date of birth, ID band Patient awake    Reviewed: Allergy & Precautions, NPO status , Patient's Chart, lab work & pertinent test results  History of Anesthesia Complications Negative for: history of anesthetic complications  Airway Mallampati: III  TM Distance: >3 FB Neck ROM: Full    Dental  (+) Missing, Dental Advisory Given   Pulmonary neg pulmonary ROS,    breath sounds clear to auscultation       Cardiovascular Exercise Tolerance: Good hypertension, Pt. on medications  Rhythm:Regular Rate:Normal     Neuro/Psych Anxiety negative neurological ROS     GI/Hepatic Neg liver ROS, GERD  Medicated and Controlled,  Endo/Other  diabetes (fsbs -155), Well Controlled, Type 2, Oral Hypoglycemic Agents  Renal/GU negative Renal ROS     Musculoskeletal negative musculoskeletal ROS (+)   Abdominal   Peds  Hematology negative hematology ROS (+)   Anesthesia Other Findings   Reproductive/Obstetrics negative OB ROS                             Anesthesia Physical Anesthesia Plan  ASA: II  Anesthesia Plan: General   Post-op Pain Management:    Induction: Intravenous  PONV Risk Score and Plan: 4 or greater and Ondansetron, Dexamethasone, Midazolam and Treatment may vary due to age or medical condition  Airway Management Planned: Oral ETT  Additional Equipment:   Intra-op Plan:   Post-operative Plan:   Informed Consent: I have reviewed the patients History and Physical, chart, labs and discussed the procedure including the risks, benefits and alternatives for the proposed anesthesia with the patient or authorized representative who has indicated his/her understanding and acceptance.     Dental advisory given  Plan Discussed with: CRNA and Surgeon  Anesthesia Plan Comments:         Anesthesia Quick Evaluation

## 2019-08-21 NOTE — Discharge Instructions (Signed)
Discharge Laparoscopic Surgery Instructions:  Common Complaints: Right shoulder pain is common after laparoscopic surgery. This is secondary to the gas used in the surgery being trapped under the diaphragm.  Walk to help your body absorb the gas. This will improve in a few days. Pain at the port sites are common, especially the larger port sites. This will improve with time.  Some nausea is common and poor appetite. The main goal is to stay hydrated the first few days after surgery.   Diet/ Activity: Diet as tolerated. You may not have an appetite, but it is important to stay hydrated. Drink 64 ounces of water a day. Your appetite will return with time.  Shower per your regular routine daily.  Do not take hot showers. Take warm showers that are less than 10 minutes. Rest and listen to your body, but do not remain in bed all day.  Walk everyday for at least 15-20 minutes. Deep cough and move around every 1-2 hours in the first few days after surgery.  Do not lift > 10 lbs, perform excessive bending, pushing, pulling, squatting for 1-2 weeks after surgery.  Do not pick at the dermabond glue on your incision sites.  This glue film will remain in place for 1-2 weeks and will start to peel off.  Do not place lotions or balms on your incision unless instructed to specifically by Dr. Bridges.   Pain Expectations and Narcotics: -After surgery you will have pain associated with your incisions and this is normal. The pain is muscular and nerve pain, and will get better with time. -You are encouraged and expected to take non narcotic medications like tylenol and ibuprofen (when able) to treat pain as multiple modalities can aid with pain treatment. -Narcotics are only used when pain is severe or there is breakthrough pain. -You are not expected to have a pain score of 0 after surgery, as we cannot prevent pain. A pain score of 3-4 that allows you to be functional, move, walk, and tolerate some activity is  the goal. The pain will continue to improve over the days after surgery and is dependent on your surgery. -Due to [place] law, we are only able to give a certain amount of pain medication to treat post operative pain, and we only give additional narcotics on a patient by patient basis.  -For most laparoscopic surgery, studies have shown that the majority of patients only need 10-15 narcotic pills, and for open surgeries most patients only need 15-20.   -Having appropriate expectations of pain and knowledge of pain management with non narcotics is important as we do not want anyone to become addicted to narcotic pain medication.  -Using ice packs in the first 48 hours and heating pads after 48 hours, wearing an abdominal binder (when recommended), and using over the counter medications are all ways to help with pain management.   -Simple acts like meditation and mindfulness practices after surgery can also help with pain control and research has proven the benefit of these practices.  Medication: Take tylenol and ibuprofen as needed for pain control, alternating every 4-6 hours.  Example:  Tylenol 1000mg @ 6am, 12noon, 6pm, 12midnight (Do not exceed 4000mg of tylenol a day). Ibuprofen 800mg @ 9am, 3pm, 9pm, 3am (Do not exceed 3600mg of ibuprofen a day).  Take Roxicodone for breakthrough pain every 4 hours.  Take Colace for constipation related to narcotic pain medication. If you do not have a bowel movement in 2 days, take Miralax   over the counter.  Drink plenty of water to also prevent constipation.   Contact Information: If you have questions or concerns, please call our office, [phone], Monday- Thursday 8AM-5PM and Friday 8AM-12Noon.  If it is after hours or on the weekend, please call [facility] Main Number, [phone], and ask to speak to the surgeon on call for Dr. Bridges at [place].    Laparoscopic Cholecystectomy, Care After This sheet gives you information about how to care for  yourself after your procedure. Your health care provider may also give you more specific instructions. If you have problems or questions, contact your health care provider. What can I expect after the procedure? After the procedure, it is common to have:  Pain at your incision sites. You will be given medicines to control this pain.  Mild nausea or vomiting.  Bloating and possible shoulder pain from the air-like gas that was used during the procedure. Follow these instructions at home: Incision care   Follow instructions from your health care provider about how to take care of your incisions. Make sure you: ? Wash your hands with soap and water before you change your bandage (dressing). If soap and water are not available, use hand sanitizer. ? Change your dressing as told by your health care provider. ? Leave stitches (sutures), skin glue, or adhesive strips in place. These skin closures may need to be in place for 2 weeks or longer. If adhesive strip edges start to loosen and curl up, you may trim the loose edges. Do not remove adhesive strips completely unless your health care provider tells you to do that.  Do not take baths, swim, or use a hot tub until your health care provider approves.   You may shower.  Check your incision area every day for signs of infection. Check for: ? More redness, swelling, or pain. ? More fluid or blood. ? Warmth. ? Pus or a bad smell. Activity  Do not drive or use heavy machinery while taking prescription pain medicine.  Do not lift anything that is heavier than 10 lb (4.5 kg) until your health care provider approves.  Do not play contact sports until your health care provider approves.  Do not drive for 24 hours if you were given a medicine to help you relax (sedative).  Rest as needed. Do not return to work or school until your health care provider approves. General instructions  Take over-the-counter and prescription medicines only as told  by your health care provider.  To prevent or treat constipation while you are taking prescription pain medicine, your health care provider may recommend that you: ? Drink enough fluid to keep your urine clear or pale yellow. ? Take over-the-counter or prescription medicines. ? Eat foods that are high in fiber, such as fresh fruits and vegetables, whole grains, and beans. ? Limit foods that are high in fat and processed sugars, such as fried and sweet foods. Contact a health care provider if:  You develop a rash.  You have more redness, swelling, or pain around your incisions.  You have more fluid or blood coming from your incisions.  Your incisions feel warm to the touch.  You have pus or a bad smell coming from your incisions.  You have a fever.  One or more of your incisions breaks open. Get help right away if:  You have trouble breathing.  You have chest pain.  You have increasing pain in your shoulders.  You faint or feel dizzy when   you stand.  You have severe pain in your abdomen.  You have nausea or vomiting that lasts for more than one day.  You have leg pain. This information is not intended to replace advice given to you by your health care provider. Make sure you discuss any questions you have with your health care provider. Document Revised: 05/03/2017 Document Reviewed: 11/07/2015 Elsevier Patient Education  2020 Elsevier Inc.   

## 2019-08-21 NOTE — Op Note (Signed)
Operative Note   Preoperative Diagnosis: Chronic cholecystitis with cholelithiasis    Postoperative Diagnosis: Acute on chronic cholecystitis with cholelithiasis and hydrops    Procedure(s) Performed: Laparoscopic cholecystectomy   Surgeon: Leatrice Jewels. Henreitta Leber, MD   Assistants: No qualified resident was available   Anesthesia: General endotracheal   Anesthesiologist: Molli Barrows, MD    Specimens: Gallbladder    Estimated Blood Loss: Minimal    Blood Replacement: None    Complications: None    Operative Findings:  Distended, inflamed gallbladder with stones and hydrops    Procedure: The patient was taken to the operating room and placed supine. General endotracheal anesthesia was induced. Intravenous antibiotics were administered per protocol. An orogastric tube positioned to decompress the stomach. The abdomen was prepared and draped in the usual sterile fashion.    A supraumbilical incision was made and a Veress technique was utilized to achieve pneumoperitoneum to 15 mmHg with carbon dioxide. A 11 mm optiview port was placed through the supraumbilical region, and a 10 mm 0-degree operative laparoscope was introduced. The area underlying the trocar and Veress needle were inspected and without evidence of injury.  Remaining trocars were placed under direct vision. Two 5 mm ports were placed in the right abdomen, between the anterior axillary and midclavicular line.  A final 11 mm port was placed through the mid-epigastrium, near the falciform ligament.    The gallbladder fundus was elevated cephalad and the infundibulum was retracted to the patient's right. The gallbladder/cystic duct junction was skeletonized. The cystic artery noted in the triangle of Calot and was also skeletonized.  We then continued liberal medial and lateral dissection until the critical view of safety was achieved.    There was a small vein tracking with the cystic duct that was doubly clipped and  divided.  The cystic duct was triply clipped and cystic artery was doubly clipped and both were divided. The gallbladder was then dissected from the liver bed with electrocautery. The gallbladder was so inflamed and intrahepatic that it was difficult to get the gallbladder off the hepatic wall. There was some spillage of fluid which looked like hydrops and stones. The stones were all collected. The specimen was placed in an Endopouch and was retrieved through the epigastric site.    Final inspection revealed acceptable hemostasis. Surgical SNOW was placed in the gallbladder bed.  Trocars were removed and pneumoperitoneum was released.  0 Vicryl fascial sutures were used to close the epigastric and umbilical port sites. Skin incisions were closed with 4-0 Monocryl subcuticular sutures and Dermabond. The patient was awakened from anesthesia and extubated without complication.    Algis Greenhouse, MD [facility] [address] [place] [place], [place] [address] [phone] (office)

## 2019-08-21 NOTE — Transfer of Care (Signed)
Immediate Anesthesia Transfer of Care Note  Patient: Kiara Kent  Procedure(s) Performed: LAPAROSCOPIC CHOLECYSTECTOMY (N/A )  Patient Location: PACU  Anesthesia Type:General  Level of Consciousness: awake, alert  and oriented  Airway & Oxygen Therapy: Patient Spontanous Breathing and non-rebreather face mask  Post-op Assessment: Report given to RN and Post -op Vital signs reviewed and stable  Post vital signs: Reviewed and stable  Last Vitals:  Vitals Value Taken Time  BP 124/71 08/21/19 1036  Temp    Pulse 107 08/21/19 1037  Resp 19 08/21/19 1040  SpO2 100 % 08/21/19 1037  Vitals shown include unvalidated device data.  Last Pain:  Vitals:   08/21/19 0815  PainSc: 0-No pain         Complications: No apparent anesthesia complications

## 2019-08-21 NOTE — Interval H&P Note (Signed)
History and Physical Interval Note:  08/21/2019 7:20 AM  Kiara Kent  has presented today for surgery, with the diagnosis of Chronic cholecystitis; cholelithiasis.  The various methods of treatment have been discussed with the patient and family. After consideration of risks, benefits and other options for treatment, the patient has consented to  Procedure(s) with comments: LAPAROSCOPIC CHOLECYSTECTOMY (N/A) - pt to have RAPID covid test AM of procedure as a surgical intervention.  The patient's history has been reviewed, patient examined, no change in status, stable for surgery.  I have reviewed the patient's chart and labs.  Questions were answered to the patient's satisfaction.    No questions or changes.  Lucretia Roers

## 2019-08-21 NOTE — Anesthesia Procedure Notes (Signed)
Procedure Name: Intubation Date/Time: 08/21/2019 9:02 AM Performed by: Mayer Camel, CRNA Pre-anesthesia Checklist: Patient identified, Emergency Drugs available, Suction available and Patient being monitored Patient Re-evaluated:Patient Re-evaluated prior to induction Oxygen Delivery Method: Circle System Utilized Preoxygenation: Pre-oxygenation with 100% oxygen Induction Type: IV induction Ventilation: Mask ventilation without difficulty Laryngoscope Size: Glidescope and 4 Grade View: Grade I Tube type: Oral Tube size: 7.0 mm Number of attempts: 1 Airway Equipment and Method: Stylet and Video-laryngoscopy Placement Confirmation: ETT inserted through vocal cords under direct vision,  positive ETCO2 and breath sounds checked- equal and bilateral Secured at: 21 cm Tube secured with: Tape Dental Injury: Teeth and Oropharynx as per pre-operative assessment  Comments: DVL x 1 Miller 2, no visualization of cords, switched to Glide size 4 and no problems

## 2019-08-24 LAB — SURGICAL PATHOLOGY

## 2019-08-25 ENCOUNTER — Telehealth: Payer: Self-pay

## 2019-08-25 NOTE — Telephone Encounter (Signed)
Patient requesting return to work note for 09/09/19. She has follow up appointment 09/08/19. Patient states she is not fully ready to return to work at this time. Cholecystectomy done 08/21/19.

## 2019-09-03 DIAGNOSIS — Z23 Encounter for immunization: Secondary | ICD-10-CM | POA: Diagnosis not present

## 2019-09-08 ENCOUNTER — Telehealth (INDEPENDENT_AMBULATORY_CARE_PROVIDER_SITE_OTHER): Payer: Self-pay | Admitting: General Surgery

## 2019-09-08 DIAGNOSIS — K8010 Calculus of gallbladder with chronic cholecystitis without obstruction: Secondary | ICD-10-CM

## 2019-09-08 NOTE — Progress Notes (Signed)
[facility]  I am calling the patient for post operative evaluation due to the current COVID 19 pandemic.  The patient had a laparoscopic cholecystectomy on 08/21/2019. I could not get in touch with the patient. Her voicemail was not set up and I tried her significant other, Romero Belling, and left a message notifying him to have her call with issues and that the pathology was good.   Will see the patient PRN.   Pathology: FINAL MICROSCOPIC DIAGNOSIS:   A. GALLBLADDER;  - Acute and chronic cholecystitis and cholelithiasis.  - Attached hepatic parenchyma.  - There is no evidence of malignancy.  Algis Greenhouse, MD [facility] [address] [place]ella Raring [place], [place] [address] [phone] (office)

## 2019-09-16 ENCOUNTER — Telehealth: Payer: Self-pay

## 2019-09-16 NOTE — Telephone Encounter (Signed)
Disability completed and faxed to [phone]. Patient notified.

## 2019-09-24 ENCOUNTER — Telehealth: Payer: Self-pay

## 2019-09-24 NOTE — Telephone Encounter (Signed)
Standard return to work note faxed today [phone].

## 2019-09-25 DIAGNOSIS — Z23 Encounter for immunization: Secondary | ICD-10-CM | POA: Diagnosis not present

## 2020-05-23 DIAGNOSIS — H52223 Regular astigmatism, bilateral: Secondary | ICD-10-CM | POA: Diagnosis not present

## 2020-05-23 DIAGNOSIS — H5203 Hypermetropia, bilateral: Secondary | ICD-10-CM | POA: Diagnosis not present

## 2020-05-23 DIAGNOSIS — H2513 Age-related nuclear cataract, bilateral: Secondary | ICD-10-CM | POA: Diagnosis not present

## 2020-05-23 DIAGNOSIS — H524 Presbyopia: Secondary | ICD-10-CM | POA: Diagnosis not present

## 2020-07-13 DIAGNOSIS — Z0000 Encounter for general adult medical examination without abnormal findings: Secondary | ICD-10-CM | POA: Diagnosis not present

## 2020-07-13 DIAGNOSIS — R300 Dysuria: Secondary | ICD-10-CM | POA: Diagnosis not present

## 2020-07-13 DIAGNOSIS — Z79899 Other long term (current) drug therapy: Secondary | ICD-10-CM | POA: Diagnosis not present

## 2020-07-13 DIAGNOSIS — E1159 Type 2 diabetes mellitus with other circulatory complications: Secondary | ICD-10-CM | POA: Diagnosis not present

## 2020-07-13 DIAGNOSIS — E785 Hyperlipidemia, unspecified: Secondary | ICD-10-CM | POA: Diagnosis not present

## 2020-07-13 DIAGNOSIS — Z780 Asymptomatic menopausal state: Secondary | ICD-10-CM | POA: Diagnosis not present

## 2020-07-13 DIAGNOSIS — E1169 Type 2 diabetes mellitus with other specified complication: Secondary | ICD-10-CM | POA: Diagnosis not present

## 2020-07-13 DIAGNOSIS — R8290 Unspecified abnormal findings in urine: Secondary | ICD-10-CM | POA: Diagnosis not present

## 2020-07-13 DIAGNOSIS — Z23 Encounter for immunization: Secondary | ICD-10-CM | POA: Diagnosis not present

## 2020-07-13 DIAGNOSIS — I152 Hypertension secondary to endocrine disorders: Secondary | ICD-10-CM | POA: Diagnosis not present

## 2020-07-13 DIAGNOSIS — Z1231 Encounter for screening mammogram for malignant neoplasm of breast: Secondary | ICD-10-CM | POA: Diagnosis not present

## 2020-08-19 DIAGNOSIS — R350 Frequency of micturition: Secondary | ICD-10-CM | POA: Diagnosis not present

## 2020-08-19 DIAGNOSIS — R300 Dysuria: Secondary | ICD-10-CM | POA: Diagnosis not present

## 2020-08-19 DIAGNOSIS — N3281 Overactive bladder: Secondary | ICD-10-CM | POA: Diagnosis not present

## 2020-08-19 DIAGNOSIS — R3915 Urgency of urination: Secondary | ICD-10-CM | POA: Diagnosis not present

## 2020-08-19 DIAGNOSIS — E1159 Type 2 diabetes mellitus with other circulatory complications: Secondary | ICD-10-CM | POA: Diagnosis not present

## 2020-08-19 DIAGNOSIS — I152 Hypertension secondary to endocrine disorders: Secondary | ICD-10-CM | POA: Diagnosis not present

## 2020-10-25 DIAGNOSIS — H5213 Myopia, bilateral: Secondary | ICD-10-CM | POA: Diagnosis not present

## 2021-01-04 DIAGNOSIS — I152 Hypertension secondary to endocrine disorders: Secondary | ICD-10-CM | POA: Diagnosis not present

## 2021-01-04 DIAGNOSIS — S43421A Sprain of right rotator cuff capsule, initial encounter: Secondary | ICD-10-CM | POA: Diagnosis not present

## 2021-01-04 DIAGNOSIS — E1169 Type 2 diabetes mellitus with other specified complication: Secondary | ICD-10-CM | POA: Diagnosis not present

## 2021-01-04 DIAGNOSIS — E1159 Type 2 diabetes mellitus with other circulatory complications: Secondary | ICD-10-CM | POA: Diagnosis not present

## 2021-01-04 DIAGNOSIS — D509 Iron deficiency anemia, unspecified: Secondary | ICD-10-CM | POA: Diagnosis not present

## 2021-01-04 DIAGNOSIS — Z1231 Encounter for screening mammogram for malignant neoplasm of breast: Secondary | ICD-10-CM | POA: Diagnosis not present

## 2021-01-04 DIAGNOSIS — E559 Vitamin D deficiency, unspecified: Secondary | ICD-10-CM | POA: Diagnosis not present

## 2021-01-04 DIAGNOSIS — Z1239 Encounter for other screening for malignant neoplasm of breast: Secondary | ICD-10-CM | POA: Diagnosis not present

## 2021-01-04 DIAGNOSIS — E785 Hyperlipidemia, unspecified: Secondary | ICD-10-CM | POA: Diagnosis not present

## 2021-01-04 DIAGNOSIS — M25511 Pain in right shoulder: Secondary | ICD-10-CM | POA: Diagnosis not present

## 2021-04-30 IMAGING — DX DG ABDOMEN 1V
2 series · 2 of 2 positions shown · non-contrast
Comparison: Acute abdominal series 08/06/2019

CLINICAL DATA: No bowel movement for 1 week, mid abdominal pain for
15 days

EXAM:
ABDOMEN - 1 VIEW

[abdomen supine grid (1 of 2)]
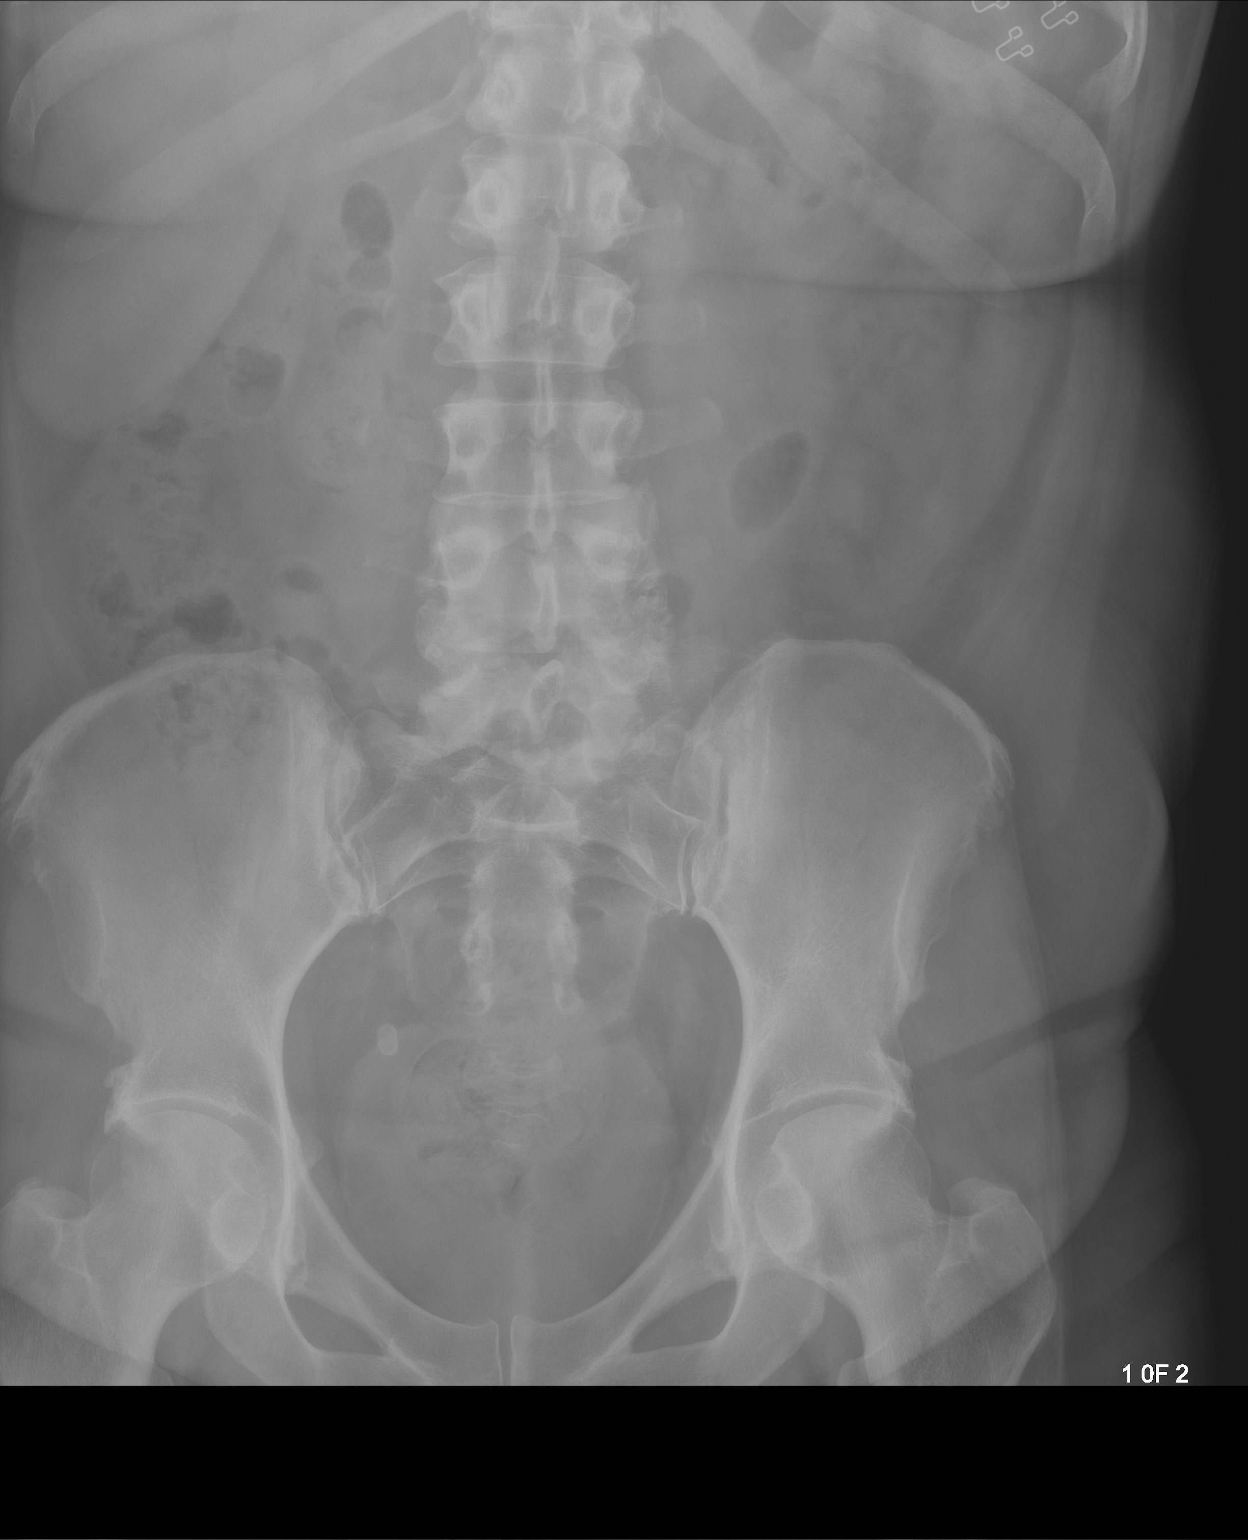

[abdomen supine grid (2 of 2)]
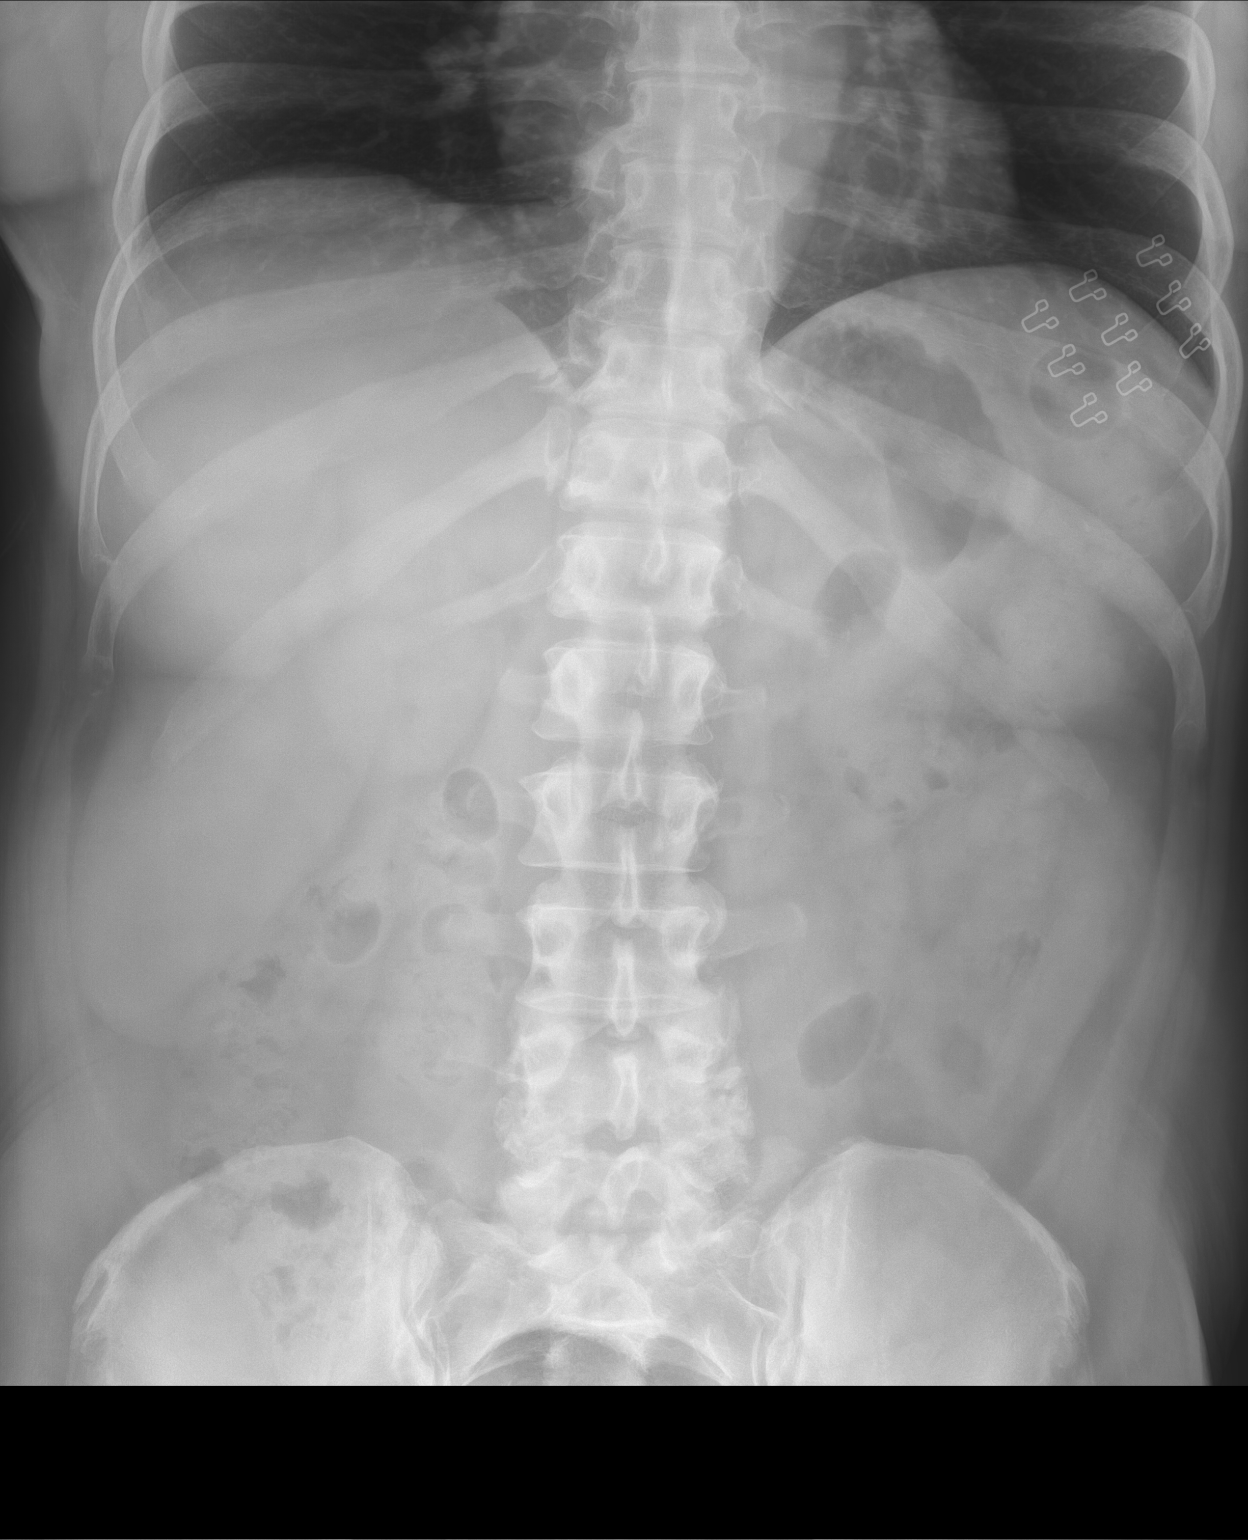

[2 of 2 positions shown; findings below may reference images not displayed]

FINDINGS: No high-grade obstructive bowel gas pattern or large stool burden is
seen. No suspicious calcifications over the gallbladder or renal
shadows. Lung bases are clear. Inferior cardiomediastinal contours
are unremarkable. Mild degenerative changes in the lower lumbar
spine and pelvis. Stable appearance of a phlebolith in the right
hemipelvis. Bones and soft tissues are otherwise unremarkable.
IMPRESSION: No acute findings. No evidence of bowel obstruction or large stool
burden.

## 2021-08-02 ENCOUNTER — Encounter: Payer: Self-pay | Admitting: Family Medicine

## 2021-08-02 ENCOUNTER — Ambulatory Visit: Payer: BC Managed Care – PPO | Admitting: Family Medicine

## 2021-08-02 VITALS — BP 130/82 | HR 90 | Temp 98.7°F | Ht 69.0 in | Wt 210.4 lb

## 2021-08-02 DIAGNOSIS — G8929 Other chronic pain: Secondary | ICD-10-CM

## 2021-08-02 DIAGNOSIS — M25511 Pain in right shoulder: Secondary | ICD-10-CM | POA: Diagnosis not present

## 2021-08-02 DIAGNOSIS — E1169 Type 2 diabetes mellitus with other specified complication: Secondary | ICD-10-CM

## 2021-08-02 DIAGNOSIS — Z1211 Encounter for screening for malignant neoplasm of colon: Secondary | ICD-10-CM

## 2021-08-02 DIAGNOSIS — I152 Hypertension secondary to endocrine disorders: Secondary | ICD-10-CM | POA: Diagnosis not present

## 2021-08-02 DIAGNOSIS — E559 Vitamin D deficiency, unspecified: Secondary | ICD-10-CM | POA: Diagnosis not present

## 2021-08-02 DIAGNOSIS — E1159 Type 2 diabetes mellitus with other circulatory complications: Secondary | ICD-10-CM | POA: Diagnosis not present

## 2021-08-02 DIAGNOSIS — N3090 Cystitis, unspecified without hematuria: Secondary | ICD-10-CM | POA: Diagnosis not present

## 2021-08-02 DIAGNOSIS — E785 Hyperlipidemia, unspecified: Secondary | ICD-10-CM

## 2021-08-02 DIAGNOSIS — F411 Generalized anxiety disorder: Secondary | ICD-10-CM

## 2021-08-02 DIAGNOSIS — Z1212 Encounter for screening for malignant neoplasm of rectum: Secondary | ICD-10-CM

## 2021-08-02 DIAGNOSIS — K219 Gastro-esophageal reflux disease without esophagitis: Secondary | ICD-10-CM | POA: Diagnosis not present

## 2021-08-02 DIAGNOSIS — R300 Dysuria: Secondary | ICD-10-CM | POA: Diagnosis not present

## 2021-08-02 DIAGNOSIS — K581 Irritable bowel syndrome with constipation: Secondary | ICD-10-CM | POA: Diagnosis not present

## 2021-08-02 DIAGNOSIS — Z23 Encounter for immunization: Secondary | ICD-10-CM | POA: Diagnosis not present

## 2021-08-02 DIAGNOSIS — E119 Type 2 diabetes mellitus without complications: Secondary | ICD-10-CM | POA: Diagnosis not present

## 2021-08-02 DIAGNOSIS — Z6831 Body mass index (BMI) 31.0-31.9, adult: Secondary | ICD-10-CM

## 2021-08-02 LAB — CMP14+EGFR
ALT: 14 IU/L (ref 0–32)
AST: 12 IU/L (ref 0–40)
Albumin/Globulin Ratio: 1.5 (ref 1.2–2.2)
Albumin: 4.5 g/dL (ref 3.8–4.9)
Alkaline Phosphatase: 60 IU/L (ref 44–121)
BUN/Creatinine Ratio: 15 (ref 9–23)
BUN: 13 mg/dL (ref 6–24)
Bilirubin Total: 0.3 mg/dL (ref 0.0–1.2)
CO2: 24 mmol/L (ref 20–29)
Calcium: 9.5 mg/dL (ref 8.7–10.2)
Chloride: 101 mmol/L (ref 96–106)
Creatinine, Ser: 0.84 mg/dL (ref 0.57–1.00)
Globulin, Total: 3 g/dL (ref 1.5–4.5)
Glucose: 228 mg/dL — ABNORMAL HIGH (ref 70–99)
Potassium: 4 mmol/L (ref 3.5–5.2)
Sodium: 138 mmol/L (ref 134–144)
Total Protein: 7.5 g/dL (ref 6.0–8.5)
eGFR: 80 mL/min/{1.73_m2} (ref >59)

## 2021-08-02 LAB — URINALYSIS, ROUTINE W REFLEX MICROSCOPIC
Bilirubin, UA: NEGATIVE
Glucose, UA: NEGATIVE
Ketones, UA: NEGATIVE
Nitrite, UA: POSITIVE — AB
Protein,UA: NEGATIVE
Specific Gravity, UA: 1.01 (ref 1.005–1.030)
Urobilinogen, Ur: 0.2 mg/dL (ref 0.2–1.0)
pH, UA: 8.5 — ABNORMAL HIGH (ref 5.0–7.5)

## 2021-08-02 LAB — CBC WITH DIFFERENTIAL/PLATELET
Basophils Absolute: 0 10*3/uL (ref 0.0–0.2)
Basos: 1 %
EOS (ABSOLUTE): 0.1 10*3/uL (ref 0.0–0.4)
Eos: 2 %
Hematocrit: 35.2 % (ref 34.0–46.6)
Hemoglobin: 11.4 g/dL (ref 11.1–15.9)
Immature Grans (Abs): 0 10*3/uL (ref 0.0–0.1)
Immature Granulocytes: 0 %
Lymphocytes Absolute: 1.4 10*3/uL (ref 0.7–3.1)
Lymphs: 32 %
MCH: 31.1 pg (ref 26.6–33.0)
MCHC: 32.4 g/dL (ref 31.5–35.7)
MCV: 96 fL (ref 79–97)
Monocytes Absolute: 0.3 10*3/uL (ref 0.1–0.9)
Monocytes: 8 %
Neutrophils Absolute: 2.5 10*3/uL (ref 1.4–7.0)
Neutrophils: 57 %
Platelets: 282 10*3/uL (ref 150–450)
RBC: 3.66 x10E6/uL — ABNORMAL LOW (ref 3.77–5.28)
RDW: 12.3 % (ref 11.7–15.4)
WBC: 4.4 10*3/uL (ref 3.4–10.8)

## 2021-08-02 LAB — MICROSCOPIC EXAMINATION
RBC, Urine: NONE SEEN /hpf (ref 0–2)
WBC, UA: >30 /hpf — ABNORMAL HIGH (ref 0–5)

## 2021-08-02 LAB — BAYER DCA HB A1C WAIVED: HB A1C (BAYER DCA - WAIVED): 8.8 % — ABNORMAL HIGH (ref 4.8–5.6)

## 2021-08-02 LAB — LIPID PANEL
Chol/HDL Ratio: 4.1 ratio (ref 0.0–4.4)
Cholesterol, Total: 202 mg/dL — ABNORMAL HIGH (ref 100–199)
HDL: 49 mg/dL (ref >39)
LDL Chol Calc (NIH): 126 mg/dL — ABNORMAL HIGH (ref 0–99)
Triglycerides: 154 mg/dL — ABNORMAL HIGH (ref 0–149)
VLDL Cholesterol Cal: 27 mg/dL (ref 5–40)

## 2021-08-02 MED ORDER — KETOROLAC TROMETHAMINE 30 MG/ML IJ SOLN
30.0000 mg | Freq: Once | INTRAMUSCULAR | Status: AC
  Administered 2021-08-02: 30 mg via INTRAMUSCULAR

## 2021-08-02 MED ORDER — SULFAMETHOXAZOLE-TRIMETHOPRIM 800-160 MG PO TABS: 1.0000 | ORAL_TABLET | Freq: Two times a day (BID) | ORAL | 0 refills | Status: AC

## 2021-08-02 MED ORDER — METFORMIN HCL 500 MG PO TABS
500.0000 mg | ORAL_TABLET | Freq: Two times a day (BID) | ORAL | 3 refills | Status: DC
Start: 2021-08-02 — End: 2022-03-20

## 2021-08-02 NOTE — Patient Instructions (Signed)

## 2021-08-02 NOTE — Progress Notes (Addendum)
Subjective:  Patient ID: Kiara Kent, female    DOB: 10/09/1960, 61 y.o.   MRN: [id number]  Patient Care Team: Baruch Gouty, FNP as PCP - General (Family Medicine)   Chief Complaint:  New Patient (Initial Visit)   HPI: Kiara Kent is a 61 y.o. female presenting on 08/02/2021 for New Patient (Initial Visit)   Kiara Kent, "Joseph Art", presents to establish care. She endorses dysuria and increased frequency. She endorses decreased range of motion of the right shoulder beginning in July of 2022 unrelated to a trauma.  She has previously tried muscle relaxants and analgesics with little relief.  She has not been to see ortho.  PMH includes diabetes mellitus type 2 without insulin use, IBS with constipation, overactive bladder, vitamin D deficiency, GERD, hyperlipidemia, and hypertension. Past surgical history includes cholecystectomy, bilateral tubal ligation.    Relevant past medical, surgical, family, and social history reviewed and updated as indicated.  Allergies and medications reviewed and updated. Data reviewed: Chart in Epic.   Past Medical History:  Diagnosis Date   Anxiety    Diabetes mellitus without complication ([place])    Hypercholesterolemia    Hypertension     Past Surgical History:  Procedure Laterality Date   CHOLECYSTECTOMY N/A 08/21/2019   Procedure: LAPAROSCOPIC CHOLECYSTECTOMY;  Surgeon: Virl Cagey, MD;  Location: [facility];  Service: General;  Laterality: N/A;   TUBAL LIGATION      Social History   Socioeconomic History   Marital status: Single    Spouse name: Not on file   Number of children: Not on file   Years of education: Not on file   Highest education level: Not on file  Occupational History   Not on file  Tobacco Use   Smoking status: Never   Smokeless tobacco: Never  Vaping Use   Vaping Use: Never used  Substance and Sexual Activity   Alcohol use: Never   Drug use: Never   Sexual activity: Yes    Birth control/protection: Surgical   Other Topics Concern   Not on file  Social History Narrative   Not on file   Social Determinants of Health   Financial Resource Strain: Not on file  Food Insecurity: Not on file  Transportation Needs: Not on file  Physical Activity: Not on file  Stress: Not on file  Social Connections: Not on file  Intimate Partner Violence: Not on file    Outpatient Encounter Medications as of 08/02/2021  Medication Sig   amLODipine (NORVASC) 5 MG tablet Take 5 mg by mouth daily.   buPROPion (WELLBUTRIN XL) 150 MG 24 hr tablet Take 150 mg by mouth every morning.   lisinopril (ZESTRIL) 20 MG tablet Take 20 mg by mouth daily.   simvastatin (ZOCOR) 40 MG tablet Take 40 mg by mouth daily.   Vitamin D, Ergocalciferol, (DRISDOL) 1.25 MG (50000 UNIT) CAPS capsule Take 50,000 Units by mouth once a week.   metFORMIN (GLUCOPHAGE) 500 MG tablet Take 500 mg by mouth 2 (two) times daily.   [DISCONTINUED] dicyclomine (BENTYL) 20 MG tablet Take 1 tablet (20 mg total) by mouth 2 (two) times daily.   [DISCONTINUED] metFORMIN (GLUCOPHAGE) 1000 MG tablet Take by mouth.   [DISCONTINUED] ondansetron (ZOFRAN) 8 MG tablet Take 1 tablet (8 mg total) by mouth every 4 (four) hours as needed.   [DISCONTINUED] pantoprazole (PROTONIX) 20 MG tablet Take 1 tablet (20 mg total) by mouth 2 (two) times daily.   No facility-administered encounter medications on file as  of 08/02/2021.    No Known Allergies  Review of Systems  Constitutional:  Negative for activity change, appetite change, chills, diaphoresis, fatigue, fever and unexpected weight change.  Respiratory:  Negative for cough and shortness of breath.   Cardiovascular:  Negative for chest pain, palpitations and leg swelling.  Gastrointestinal:  Positive for constipation. Negative for abdominal pain.  Endocrine: Positive for polyuria. Negative for polydipsia and polyphagia.  Genitourinary:  Positive for dysuria, frequency and urgency. Negative for decreased urine volume,  difficulty urinating, dyspareunia, enuresis, flank pain, genital sores, hematuria, menstrual problem, pelvic pain, vaginal bleeding, vaginal discharge and vaginal pain.  Musculoskeletal:  Positive for arthralgias. Negative for back pain.  Neurological:  Negative for weakness and headaches.  Psychiatric/Behavioral:  Negative for confusion and dysphoric mood. The patient is not nervous/anxious.   All other systems reviewed and are negative.      Objective:  BP 130/82    Pulse 90    Temp 98.7 F (37.1 C) (Temporal)    Ht 5' 9"  (1.753 m)    Wt 95.4 kg    BMI 31.07 kg/m    Wt Readings from Last 3 Encounters:  08/02/21 95.4 kg  08/20/19 91.2 kg  08/06/19 91.2 kg    Physical Exam Vitals and nursing note reviewed.  Constitutional:      Appearance: Normal appearance. She is well-groomed. She is obese.  HENT:     Head: Normocephalic and atraumatic.  Eyes:     Conjunctiva/sclera: Conjunctivae normal.     Pupils: Pupils are equal, round, and reactive to light.  Cardiovascular:     Rate and Rhythm: Normal rate and regular rhythm.     Heart sounds: Normal heart sounds.  Pulmonary:     Effort: Pulmonary effort is normal.     Breath sounds: Normal breath sounds.  Abdominal:     General: Bowel sounds are normal. There is no distension.     Palpations: Abdomen is soft. There is no mass.     Tenderness: There is no abdominal tenderness. There is no right CVA tenderness, left CVA tenderness, guarding or rebound.     Hernia: No hernia is present.  Musculoskeletal:     Right shoulder: Tenderness present. Decreased range of motion.     Left shoulder: Normal.     Cervical back: Normal range of motion.  Skin:    General: Skin is warm and dry.     Capillary Refill: Capillary refill takes less than 2 seconds.  Neurological:     General: No focal deficit present.     Mental Status: She is alert and oriented to person, place, and time.  Psychiatric:        Mood and Affect: Mood normal.         Behavior: Behavior normal.        Thought Content: Thought content normal.        Judgment: Judgment normal.       Pertinent labs & imaging results that were available during my care of the patient were reviewed by me and considered in my medical decision making.  Assessment & Plan:  Tierre was seen today for new patient (initial visit).  Diagnoses and all orders for this visit:   Type 2 diabetes mellitus with other specified complication, without long-term current use of insulin ([place]) -     Bayer DCA Hb A1c Waived -     Microalbumin / creatinine urine ratio -A1C 8.8 today, patient states she has been noncompliant with diabetetic  diet. Dicussed increasing metformin to 1048m BID. Patient agrees to work on diet changes for 3 months and increase at next visit if A1C above 8. She has an upcoming eye exam. She is overdue for podiatry exam, patient educated of the importance of this visit.   Hyperlipidemia associated with type 2 diabetes mellitus (HCC) -     Lipid panel  - Continue simvastatin 448mdaily, will adjust as needed upon results of lipid panel.  Hypertension associated with diabetes ([place]-     CBC with Differential/Platelet -     CMP14+EGFR - BP 130/82 in office today, continue Norvasc and lisinopril. Patient denies headaches, vision changes, swelling in the legs.    Gastroesophageal reflux disease without esophagitis - Patient states symptoms are resolved following cholecystectomy  Irritable bowel syndrome with constipation -Controlled with Linzess. Continue adequate water intake.   Dysuria Cystitis -     Urinalysis, Routine w reflex microscopic - Positive leuks and nitrites on UA today. Will culture. Will treat with Bactrim and adjust depending on culture results.   Vitamin D deficiency -     VITAMIN D 25 Hydroxy (Vit-D Deficiency, Fractures) - Patient taking 50,000u weekly. Will assess level today.   GAD (generalized anxiety disorder) - Controlled with Wellbutrin  15066mBMI 31.0-31.9,adult -Diet and exercise encouraged. Chronic right shoulder pain -     Ambulatory referral to Orthopedic Surgery -     Ambulatory referral to Physical Therapy - Will treat with 32m65mradol in office today. Patient educated regarding use of ice and moist heat.  Screening for colorectal cancer -Patient has never had a colonoscopy. Cologuard sent.   Health maintenance:  [facility].  Last PAP 3 years ago, WNL, per patient  Continue all other maintenance medications. Patient declines Shingrex Flu shot updated today  Follow up plan: Return in about 3 months (around 11/02/2021), or if symptoms worsen or fail to improve, for chronic follow up.   Continue healthy lifestyle choices, including diet (rich in fruits, vegetables, and lean proteins, and low in salt and simple carbohydrates) and exercise (at least 30 minutes of moderate physical activity daily).  Educational handout given for health maintenance   The above assessment and management plan was discussed with the patient. The patient verbalized understanding of and has agreed to the management plan. Patient is aware to call the clinic if they develop any new symptoms or if symptoms persist or worsen. Patient is aware when to return to the clinic for a follow-up visit. Patient educated on when it is appropriate to go to the emergency department.   Addison Avneet Ashmore, NP-S  I personally was present during the history, physical exam, and medical decision-making activities of this visit and have verified that the services and findings are accurately documented in the nurse practitioner student's note.  MichMonia PouchP-C [facility] [address] [phone]

## 2021-08-03 LAB — VITAMIN D 25 HYDROXY (VIT D DEFICIENCY, FRACTURES): Vit D, 25-Hydroxy: 27.2 ng/mL — ABNORMAL LOW (ref 30.0–100.0)

## 2021-08-03 LAB — MICROALBUMIN / CREATININE URINE RATIO
Creatinine, Urine: 63.5 mg/dL
Microalb/Creat Ratio: 40 mg/g creat — ABNORMAL HIGH (ref 0–29)
Microalbumin, Urine: 25.6 ug/mL

## 2021-08-05 LAB — URINE CULTURE

## 2021-08-14 ENCOUNTER — Other Ambulatory Visit: Payer: BC Managed Care – PPO

## 2021-08-14 ENCOUNTER — Other Ambulatory Visit: Payer: Self-pay

## 2021-08-14 DIAGNOSIS — R300 Dysuria: Secondary | ICD-10-CM | POA: Diagnosis not present

## 2021-08-15 ENCOUNTER — Other Ambulatory Visit: Payer: Self-pay

## 2021-08-15 ENCOUNTER — Ambulatory Visit: Payer: BC Managed Care – PPO | Attending: Family Medicine

## 2021-08-15 DIAGNOSIS — G8929 Other chronic pain: Secondary | ICD-10-CM | POA: Insufficient documentation

## 2021-08-15 DIAGNOSIS — M25511 Pain in right shoulder: Secondary | ICD-10-CM | POA: Diagnosis not present

## 2021-08-15 DIAGNOSIS — M25611 Stiffness of right shoulder, not elsewhere classified: Secondary | ICD-10-CM | POA: Diagnosis not present

## 2021-08-15 LAB — URINE CULTURE

## 2021-08-15 NOTE — Addendum Note (Signed)
Addended by: Candi Leash C on: 08/15/2021 06:00 PM ? ? Modules accepted: Orders ? ?

## 2021-08-15 NOTE — Therapy (Addendum)
[facility] [address] [place], [place], [address] Phone: [phone]   Fax:  [phone]  Physical Therapy Evaluation  Patient Details  Name: Kiara Kent MRN: [id number] Date of Birth: 1960-08-20 Referring Provider (PT): Rakes, FNP   Encounter Date: 08/15/2021   PT End of Session - 08/15/21 1513     Visit Number 1    Number of Visits 8    Date for PT Re-Evaluation 09/29/21    PT Start Time 1515    PT Stop Time 1607    PT Time Calculation (min) 52 min    Activity Tolerance Patient tolerated treatment well    Behavior During Therapy [facility] for tasks assessed/performed             Past Medical History:  Diagnosis Date   Anxiety    Diabetes mellitus without complication (HCC)    Hypercholesterolemia    Hypertension     Past Surgical History:  Procedure Laterality Date   CHOLECYSTECTOMY N/A 08/21/2019   Procedure: LAPAROSCOPIC CHOLECYSTECTOMY;  Surgeon: Lucretia Roers, MD;  Location: [facility];  Service: General;  Laterality: N/A;   TUBAL LIGATION      There were no vitals filed for this visit.    Subjective Assessment - 08/15/21 1513     Subjective Patient reports that her right shoulder has been bothering her since July 2022. She noticed it the first time when getting dressed one morning while she was reaching behind her back. She feels that her pain has gotten worse in over the last few months. She had an injection in her right hip last week for her shoulder which has helped reduce her pain, but not resolve it completely. She reports no history of a right shoulder injury.    Patient Stated Goals reduced pain, be able to use her arm throughout her work shift without increased pain    Currently in Pain? Yes    Pain Score 5     Pain Location Shoulder    Pain Orientation Right    Pain Descriptors / Indicators Aching    Pain Type Chronic pain    Pain Onset More than a month ago    Pain Frequency Constant     Aggravating Factors  reaching behind her back, work (using her arm)    Pain Relieving Factors injection, medication, biofreeze (reduces, but does not eliminate her pain)    Effect of Pain on Daily Activities difficulty using her right arm when it is hurting                [facility] PT Assessment - 08/15/21 0001       Assessment   Medical Diagnosis Chronic right shoulder pain    Referring Provider (PT) Rakes, FNP    Onset Date/Surgical Date --   July 2022   Hand Dominance Right    Next MD Visit 11/01/21    Prior Therapy No      Precautions   Precautions None      Restrictions   Weight Bearing Restrictions No      Balance Screen   Has the patient fallen in the past 6 months No    Has the patient had a decrease in activity level because of a fear of falling?  No    Is the patient reluctant to leave their home because of a fear of falling?  No      Home Tourist information centre manager residence      Prior  Function   Level of Independence Independent    Vocation Full time employment    Vocation Requirements Spool cleaner   8 hour shift (pain steadily increases throughout her shift)   Leisure bowling      Cognition   Overall Cognitive Status Within Functional Limits for tasks assessed    Attention Focused    Focused Attention Appears intact    Memory Appears intact    Awareness Appears intact    Problem Solving Appears intact      Sensation   Additional Comments Patient reports no numbness or tingling      ROM / Strength   AROM / PROM / Strength Strength;AROM      AROM   AROM Assessment Site Shoulder    Right/Left Shoulder Left;Right    Right Shoulder Flexion 114 Degrees   stiff and sore   Right Shoulder ABduction 93 Degrees   increased pain   Right Shoulder Internal Rotation --   to T8; tingling in shoulder   Right Shoulder External Rotation --   to T4; painful   Left Shoulder Flexion 139 Degrees    Left Shoulder ABduction 142 Degrees    Left Shoulder  Internal Rotation --   to T9   Left Shoulder External Rotation --   to T6     Strength   Strength Assessment Site Shoulder    Right/Left Shoulder Left;Right    Right Shoulder Flexion 4-/5   painful   Right Shoulder ABduction 4+/5    Right Shoulder Internal Rotation 4+/5    Right Shoulder External Rotation 4/5   painful   Left Shoulder Flexion 4/5    Left Shoulder ABduction 4+/5    Left Shoulder Internal Rotation 4+/5    Left Shoulder External Rotation 4/5      Palpation   Palpation comment No tenderness to palpation   Glenohumeral joint mobility: hypomobile and painful                       Objective measurements completed on examination: See above findings.       [facility] Adult PT Treatment/Exercise - 08/15/21 0001       Exercises   Exercises Shoulder      Shoulder Exercises: Supine   Flexion AAROM;Both;15 reps      Shoulder Exercises: Standing   Retraction Both;20 reps      Shoulder Exercises: Stretch   Internal Rotation Stretch 2 reps   30 second hold; painful   Table Stretch - Flexion 5 reps;10 seconds      Modalities   Modalities Vasopneumatic      Vasopneumatic   Number Minutes Vasopneumatic  5 minutes    Vasopnuematic Location  Shoulder    Vasopneumatic Pressure Low    Vasopneumatic Temperature  34      Manual Therapy   Manual Therapy Joint mobilization    Joint Mobilization Glenohumeral inferior glide (long axis), inferior and posterior grade I-II                          PT Long Term Goals - 08/15/21 1750       PT LONG TERM GOAL #1   Title Patient will be independent with her HEP.    Time 4    Period Weeks    Status New    Target Date 09/12/21      PT LONG TERM GOAL #2   Title Patient will be able to  demonstrate at least 120 degrees of active right shoulder flexion.    Time 4    Period Weeks    Status New    Target Date 09/12/21      PT LONG TERM GOAL #3   Title Patient will be able to reach behind her back  with her familiar pain at a 3/10 or less for improved function getting dressed.    Time 4    Period Weeks    Status New    Target Date 09/12/21      PT LONG TERM GOAL #4   Title Patient will be able to demonstrate at least 120 degrees of active right shoulder abduction for improved function reaching overhead.    Time 4    Period Weeks    Status New    Target Date 09/12/21                    Plan - 08/15/21 1632     Clinical Impression Statement Patient is a 61 year old female presenting to physical therapy with chronic right shoulder pain with no known cause. She presented with low pain severity and irritability. She exhibted reduced right shoulder AROM with pain being her primary limiting factor. However, she reported no tenderness or discomfort with palpation to her right shoulder. This may be attributed to her recent injection which has significantly reduced her familiar pain. She continues to require skilled physical therapy to address her remaining impairments to return to her prior level of function.    Personal Factors and Comorbidities Comorbidity 2;Time since onset of injury/illness/exacerbation    Comorbidities HTN, DM    Examination-Activity Limitations Reach Overhead;Carry;Lift    Examination-Participation Restrictions Occupation;Cleaning;Community Activity;Laundry    Stability/Clinical Decision Making Evolving/Moderate complexity    Clinical Decision Making Moderate    Rehab Potential Good    PT Frequency 2x / week    PT Duration 4 weeks    PT Treatment/Interventions Electrical Stimulation;Cryotherapy;Moist Heat;Neuromuscular re-education;Therapeutic exercise;Therapeutic activities;Patient/family education;Manual techniques;Dry needling;Passive range of motion;Taping;Vasopneumatic Device    PT Next Visit Plan UBE, pulleys, light rotator cuff strengthening, and modalities as needed    Consulted and Agree with Plan of Care Patient             Patient will  benefit from skilled therapeutic intervention in order to improve the following deficits and impairments:  Decreased range of motion, Impaired UE functional use, Decreased activity tolerance, Pain, Hypomobility, Decreased strength  Visit Diagnosis: Chronic right shoulder pain  Stiffness of right shoulder, not elsewhere classified     Problem List Patient Active Problem List   Diagnosis Date Noted   Type 2 diabetes mellitus with other specified complication (HCC) 08/02/2021   Hyperlipidemia associated with type 2 diabetes mellitus (HCC) 08/02/2021   GAD (generalized anxiety disorder) 08/02/2021   Vitamin D deficiency 08/02/2021   BMI 31.0-31.9,adult 08/02/2021   Gastroesophageal reflux disease without esophagitis 08/02/2021   Irritable bowel syndrome with constipation 08/02/2021   Hypertension associated with diabetes (HCC) 02/21/2015   Iron deficiency anemia 11/30/2013    Granville Lewis, PT 08/15/2021, 5:59 PM  [facility] [address] [place], [place], [address] Phone: [phone]   Fax:  [phone]  Name: Zady Reader MRN: [id number] Date of Birth: 12-23-1960

## 2021-08-17 ENCOUNTER — Other Ambulatory Visit: Payer: Self-pay

## 2021-08-17 ENCOUNTER — Ambulatory Visit: Payer: BC Managed Care – PPO

## 2021-08-17 DIAGNOSIS — M25611 Stiffness of right shoulder, not elsewhere classified: Secondary | ICD-10-CM | POA: Diagnosis not present

## 2021-08-17 DIAGNOSIS — G8929 Other chronic pain: Secondary | ICD-10-CM | POA: Diagnosis not present

## 2021-08-17 DIAGNOSIS — M25511 Pain in right shoulder: Secondary | ICD-10-CM | POA: Diagnosis not present

## 2021-08-17 NOTE — Therapy (Signed)
[place] [facility] ?[address] ?[place], [place], [address] ?Phone: [phone]   Fax:  [phone] ? ?Physical Therapy Treatment ? ?Patient Details  ?Name: Kiara Kent ?MRN: [id number] ?Date of Birth: 1960/07/15 ?Referring Provider (PT): Rakes, FNP ? ? ?Encounter Date: 08/17/2021 ? ? PT End of Session - 08/17/21 1515   ? ? Visit Number 2   ? Number of Visits 8   ? Date for PT Re-Evaluation 09/29/21   ? PT Start Time 1516   ? PT Stop Time 1600   ? PT Time Calculation (min) 44 min   ? Activity Tolerance Patient tolerated treatment well   ? Behavior During Therapy [facility] for tasks assessed/performed   ? ?  ?  ? ?  ? ? ?Past Medical History:  ?Diagnosis Date  ? Anxiety   ? Diabetes mellitus without complication (HCC)   ? Hypercholesterolemia   ? Hypertension   ? ? ?Past Surgical History:  ?Procedure Laterality Date  ? CHOLECYSTECTOMY N/A 08/21/2019  ? Procedure: LAPAROSCOPIC CHOLECYSTECTOMY;  Surgeon: Lucretia Roers, MD;  Location: [facility];  Service: General;  Laterality: N/A;  ? TUBAL LIGATION    ? ? ?There were no vitals filed for this visit. ? ? Subjective Assessment - 08/17/21 1515   ? ? Subjective Patient reports that her shoulder feels a little better today than it did at her last appointment.   ? Patient Stated Goals reduced pain, be able to use her arm throughout her work shift without increased pain   ? Currently in Pain? Yes   ? Pain Score 4    ? Pain Location Shoulder   ? Pain Orientation Right   ? Pain Onset More than a month ago   ? ?  ?  ? ?  ? ? ? ? ? ? ? ? ? ? ? ? ? ? ? ? ? ? ? ? [facility] Adult PT Treatment/Exercise - 08/17/21 0001   ? ?  ? Shoulder Exercises: Seated  ? Other Seated Exercises Self inferior glide   20 reps  ?  ? Shoulder Exercises: Standing  ? Flexion Right;10 reps;Weights   ? Shoulder Flexion Weight (lbs) 5   ? Flexion Limitations Familiar aching   ? Extension Both;20 reps   ? Extension Weight (lbs) Blue XTS   ? Row 20 reps;Both   ? Row Weight (lbs)  Blue XTS   ? Other Standing Exercises Self STM   with tennis ball  ?  ? Shoulder Exercises: Pulleys  ? Flexion 5 minutes   ?  ? Shoulder Exercises: ROM/Strengthening  ? UBE (Upper Arm Bike) 120 RPM x 8 minutes   forward and backward  ? Wall Pushups 20 reps   ?  ? Shoulder Exercises: Stretch  ? Corner Stretch 4 reps;30 seconds   ?  ? Manual Therapy  ? Manual Therapy Soft tissue mobilization   ? Soft tissue mobilization right biceps tendon   ? ?  ?  ? ?  ? ? ? ? ? ? ? ? ? ? ? ? ? ? ? PT Long Term Goals - 08/15/21 1750   ? ?  ? PT LONG TERM GOAL #1  ? Title Patient will be independent with her HEP.   ? Time 4   ? Period Weeks   ? Status New   ? Target Date 09/12/21   ?  ? PT LONG TERM GOAL #2  ? Title Patient will be able to demonstrate at least 120 degrees  of active right shoulder flexion.   ? Time 4   ? Period Weeks   ? Status New   ? Target Date 09/12/21   ?  ? PT LONG TERM GOAL #3  ? Title Patient will be able to reach behind her back with her familiar pain at a 3/10 or less for improved function getting dressed.   ? Time 4   ? Period Weeks   ? Status New   ? Target Date 09/12/21   ?  ? PT LONG TERM GOAL #4  ? Title Patient will be able to demonstrate at least 120 degrees of active right shoulder abduction for improved function reaching overhead.   ? Time 4   ? Period Weeks   ? Status New   ? Target Date 09/12/21   ? ?  ?  ? ?  ? ? ? ? ? ? ? ? Plan - 08/17/21 1515   ? ? Clinical Impression Statement Patient was introduced to multiple new interventions for improved right shoulder mobility and strength with moderate difficulty. She required minimal cuing with wall push ups for proper exercise performance to facilitate rotator cuff engagment and maintain lumbar stability. She experienced a mild increase in her familiar symptoms with weighted shoulder flexion. However, manual therapy focused on soft tissue mobilization to her biceps tendon as this was able to reduce her pain with reaching behind her back. She reported  that her shoulder felt better upon the conclusion of treatment. She continues to require skilled physical therapy to address her remaining impairments to return to her prior level of function.   ? Personal Factors and Comorbidities Comorbidity 2;Time since onset of injury/illness/exacerbation   ? Comorbidities HTN, DM   ? Examination-Activity Limitations Reach Overhead;Carry;Lift   ? Examination-Participation Restrictions Occupation;Cleaning;Community Activity;Laundry   ? Stability/Clinical Decision Making Evolving/Moderate complexity   ? Rehab Potential Good   ? PT Frequency 2x / week   ? PT Duration 4 weeks   ? PT Treatment/Interventions Electrical Stimulation;Cryotherapy;Moist Heat;Neuromuscular re-education;Therapeutic exercise;Therapeutic activities;Patient/family education;Manual techniques;Dry needling;Passive range of motion;Taping;Vasopneumatic Device   ? PT Next Visit Plan UBE, pulleys, light rotator cuff strengthening, and modalities as needed   ? Consulted and Agree with Plan of Care Patient   ? ?  ?  ? ?  ? ? ?Patient will benefit from skilled therapeutic intervention in order to improve the following deficits and impairments:  Decreased range of motion, Impaired UE functional use, Decreased activity tolerance, Pain, Hypomobility, Decreased strength ? ?Visit Diagnosis: ?Chronic right shoulder pain ? ?Stiffness of right shoulder, not elsewhere classified ? ? ? ? ?Problem List ?Patient Active Problem List  ? Diagnosis Date Noted  ? Type 2 diabetes mellitus with other specified complication (HCC) 08/02/2021  ? Hyperlipidemia associated with type 2 diabetes mellitus (HCC) 08/02/2021  ? GAD (generalized anxiety disorder) 08/02/2021  ? Vitamin D deficiency 08/02/2021  ? BMI 31.0-31.9,adult 08/02/2021  ? Gastroesophageal reflux disease without esophagitis 08/02/2021  ? Irritable bowel syndrome with constipation 08/02/2021  ? Hypertension associated with diabetes (HCC) 02/21/2015  ? Iron deficiency anemia  11/30/2013  ? ? ?Granville Lewis, PT ?08/17/2021, 4:35 PM ? ?[place] [facility] ?[address] ?[place], [place], [address] ?Phone: [phone]   Fax:  [phone] ? ?Name: Kiara Kent ?MRN: [id number] ?Date of Birth: 1960/08/05 ? ? ? ?

## 2021-08-22 ENCOUNTER — Encounter: Payer: Self-pay | Admitting: Physical Therapy

## 2021-08-22 ENCOUNTER — Other Ambulatory Visit: Payer: Self-pay

## 2021-08-22 ENCOUNTER — Ambulatory Visit: Payer: BC Managed Care – PPO | Admitting: Physical Therapy

## 2021-08-22 DIAGNOSIS — G8929 Other chronic pain: Secondary | ICD-10-CM

## 2021-08-22 DIAGNOSIS — M25511 Pain in right shoulder: Secondary | ICD-10-CM | POA: Diagnosis not present

## 2021-08-22 DIAGNOSIS — M25611 Stiffness of right shoulder, not elsewhere classified: Secondary | ICD-10-CM | POA: Diagnosis not present

## 2021-08-22 NOTE — Therapy (Signed)
[place] [facility] ?[address] ?[place], [place], [address] ?Phone: [phone]   Fax:  [phone] ? ?Physical Therapy Treatment ? ?Patient Details  ?Name: Kiara Kent ?MRN: [id number] ?Date of Birth: [birth year] ?Referring Provider (PT): Rakes, FNP ? ? ?Encounter Date: 08/22/2021 ? ? PT End of Session - 08/22/21 1518   ? ? Visit Number 3   ? Number of Visits 8   ? Date for PT Re-Evaluation 09/29/21   ? PT Start Time 1518   ? PT Stop Time 1600   ? PT Time Calculation (min) 42 min   ? Activity Tolerance Patient tolerated treatment well   ? Behavior During Therapy [facility] for tasks assessed/performed   ? ?  ?  ? ?  ? ? ?Past Medical History:  ?Diagnosis Date  ? Anxiety   ? Diabetes mellitus without complication (HCC)   ? Hypercholesterolemia   ? Hypertension   ? ? ?Past Surgical History:  ?Procedure Laterality Date  ? CHOLECYSTECTOMY N/A 08/21/2019  ? Procedure: LAPAROSCOPIC CHOLECYSTECTOMY;  Surgeon: Lucretia Roers, MD;  Location: [facility];  Service: General;  Laterality: N/A;  ? TUBAL LIGATION    ? ? ?There were no vitals filed for this visit. ? ? Subjective Assessment - 08/22/21 1514   ? ? Subjective Had some pain after last week while working. No too bad right now.   ? Patient Stated Goals reduced pain, be able to use her arm throughout her work shift without increased pain   ? Currently in Pain? Yes   ? Pain Score 4    ? Pain Location Shoulder   ? Pain Orientation Right   ? Pain Descriptors / Indicators Discomfort   ? Pain Type Chronic pain   ? Pain Onset More than a month ago   ? Pain Frequency Constant   ? ?  ?  ? ?  ? ? ? ? ? [facility] PT Assessment - 08/22/21 0001   ? ?  ? Assessment  ? Medical Diagnosis Chronic right shoulder pain   ? Referring Provider (PT) Rakes, FNP   ? Hand Dominance Right   ? Next MD Visit 11/01/21   ? Prior Therapy No   ?  ? Precautions  ? Precautions None   ?  ? Restrictions  ? Weight Bearing Restrictions No   ? ?  ?  ? ?  ? ? ? ? ? ? ? ? ? ? ? ? ? ? ? ?  [facility] Adult PT Treatment/Exercise - 08/22/21 0001   ? ?  ? Shoulder Exercises: Prone  ? Extension AROM;Right;20 reps   ?  ? Shoulder Exercises: Sidelying  ? External Rotation AROM;Right;20 reps   ? Flexion AROM;Right;20 reps   ?  ? Shoulder Exercises: Standing  ? Other Standing Exercises wall slides x20 reps in ER   ?  ? Shoulder Exercises: Pulleys  ? Flexion 5 minutes   ?  ? Shoulder Exercises: ROM/Strengthening  ? UBE (Upper Arm Bike) 120 RPM x6 min   ? Ranger standing; flex x20 reps   ? Wall Pushups 20 reps   ?  ? Shoulder Exercises: Isometric Strengthening  ? Flexion 5X5"   ? External Rotation 5X5"   ? Internal Rotation 5X5"   ?  ? Manual Therapy  ? Manual Therapy Soft tissue mobilization   ? Soft tissue mobilization STW to R bicep, deltoids to reduce soreness and mild tone   ? ?  ?  ? ?  ? ? ? ? ? ? ? ? ? ?  PT Education - 08/22/21 1603   ? ? Education Details [id number]   ? Person(s) Educated Patient   ? Methods Explanation;Demonstration;Handout   ? Comprehension Verbalized understanding   ? ?  ?  ? ?  ? ? ? ? ? ? PT Long Term Goals - 08/15/21 1750   ? ?  ? PT LONG TERM GOAL #1  ? Title Patient will be independent with her HEP.   ? Time 4   ? Period Weeks   ? Status New   ? Target Date 09/12/21   ?  ? PT LONG TERM GOAL #2  ? Title Patient will be able to demonstrate at least 120 degrees of active right shoulder flexion.   ? Time 4   ? Period Weeks   ? Status New   ? Target Date 09/12/21   ?  ? PT LONG TERM GOAL #3  ? Title Patient will be able to reach behind her back with her familiar pain at a 3/10 or less for improved function getting dressed.   ? Time 4   ? Period Weeks   ? Status New   ? Target Date 09/12/21   ?  ? PT LONG TERM GOAL #4  ? Title Patient will be able to demonstrate at least 120 degrees of active right shoulder abduction for improved function reaching overhead.   ? Time 4   ? Period Weeks   ? Status New   ? Target Date 09/12/21   ? ?  ?  ? ?  ? ? ? ? ? ? ? ? Plan - 08/22/21 1630   ? ? Clinical  Impression Statement Patient presented in clinic with L shoulder discomfort which is exaggerated with reaching forward which she does repeatedly while at work. Patient reported soreness for a few days after previous treatment so new less resistive HEP provided. Patient understanding of parameters and technique for all of HEP. Patient guided through light isometric and AROM exercises with continued familiar discomfort along anterior shoulder with intermittant motions. Mild tone palpable with tenderness at proximal bicep attachment and mid deltoids as well.   ? Personal Factors and Comorbidities Comorbidity 2;Time since onset of injury/illness/exacerbation   ? Comorbidities HTN, DM   ? Examination-Activity Limitations Reach Overhead;Carry;Lift   ? Examination-Participation Restrictions Occupation;Cleaning;Community Activity;Laundry   ? Stability/Clinical Decision Making Evolving/Moderate complexity   ? Rehab Potential Good   ? PT Frequency 2x / week   ? PT Duration 4 weeks   ? PT Treatment/Interventions Electrical Stimulation;Cryotherapy;Moist Heat;Neuromuscular re-education;Therapeutic exercise;Therapeutic activities;Patient/family education;Manual techniques;Dry needling;Passive range of motion;Taping;Vasopneumatic Device   ? PT Next Visit Plan UBE, pulleys, light rotator cuff strengthening, and modalities as needed   ? Consulted and Agree with Plan of Care Patient   ? ?  ?  ? ?  ? ? ?Patient will benefit from skilled therapeutic intervention in order to improve the following deficits and impairments:  Decreased range of motion, Impaired UE functional use, Decreased activity tolerance, Pain, Hypomobility, Decreased strength ? ?Visit Diagnosis: ?Chronic right shoulder pain ? ?Stiffness of right shoulder, not elsewhere classified ? ? ? ? ?Problem List ?Patient Active Problem List  ? Diagnosis Date Noted  ? Type 2 diabetes mellitus with other specified complication (HCC) 08/02/2021  ? Hyperlipidemia associated with  type 2 diabetes mellitus (HCC) 08/02/2021  ? GAD (generalized anxiety disorder) 08/02/2021  ? Vitamin D deficiency 08/02/2021  ? BMI 31.0-31.9,adult 08/02/2021  ? Gastroesophageal reflux disease without esophagitis 08/02/2021  ? Irritable bowel syndrome  with constipation 08/02/2021  ? Hypertension associated with diabetes (HCC) 02/21/2015  ? Iron deficiency anemia 11/30/2013  ? ? ?Marvell Fuller, PTA ?08/22/2021, 4:35 PM ? ?[place] [facility] ?[address] ?[place], [place], [address] ?Phone: [phone]   Fax:  [phone] ? ?Name: Kiara Kent ?MRN: [id number] ?Date of Birth: 15-May-1961 ? ? ? ?

## 2021-08-23 ENCOUNTER — Encounter: Payer: Self-pay | Admitting: Orthopedic Surgery

## 2021-08-23 ENCOUNTER — Other Ambulatory Visit: Payer: Self-pay

## 2021-08-23 ENCOUNTER — Ambulatory Visit: Payer: BC Managed Care – PPO | Admitting: Orthopedic Surgery

## 2021-08-23 ENCOUNTER — Ambulatory Visit: Payer: BC Managed Care – PPO

## 2021-08-23 VITALS — Ht 69.0 in | Wt 212.0 lb

## 2021-08-23 DIAGNOSIS — G8929 Other chronic pain: Secondary | ICD-10-CM

## 2021-08-23 DIAGNOSIS — M25511 Pain in right shoulder: Secondary | ICD-10-CM

## 2021-08-23 DIAGNOSIS — M7581 Other shoulder lesions, right shoulder: Secondary | ICD-10-CM

## 2021-08-23 MED ORDER — DICLOFENAC SODIUM 50 MG PO TBEC: 50.0000 mg | DELAYED_RELEASE_TABLET | Freq: Two times a day (BID) | ORAL | 0 refills | Status: DC

## 2021-08-23 NOTE — Patient Instructions (Signed)

## 2021-08-23 NOTE — Progress Notes (Signed)
New Patient Visit ? ?Assessment: ?Kiara Kent is a 61 y.o. female with the following: ?1. Right rotator cuff tendinitis ? ?Plan: ?Kiara Kent has right shoulder pain, which has persisted for the past 8-9 months.  No specific injury.  She has good passive range of motion, so I am not concerned about adhesive capsulitis.  Nonetheless, she does have some pain and difficulty with overhead motion.  She is already started physical therapy, and I encouraged her to continue with therapy.  In addition, I have provided her with a prescription for diclofenac.  We have also discussed a steroid injection, and she is elected to proceed.  This was completed in clinic without issues.  I would like to see her back in a month to ensure that she is seeing some progress in her symptoms. ? ? ?Procedure note injection - Right shoulder  ?  ?Verbal consent was obtained to inject the right shoulder, subacromial space ?Timeout was completed to confirm the site of injection.   ?The skin was prepped with alcohol and ethyl chloride was sprayed at the injection site.  ?A 21-gauge needle was used to inject 40 mg of Depo-Medrol and 1% lidocaine (3 cc) into the subacromial space of the right shoulder using a posterolateral approach.  ?There were no complications.  ?A sterile bandage was applied.  ? ? ? ?Follow-up: ?Return in about 4 weeks (around 09/20/2021). ? ?Subjective: ? ?Chief Complaint  ?Patient presents with  ? Shoulder Pain  ?  Rt shoulder pain for 8 mos. NKI, has been going to therapy for shoulder   ? ? ?History of Present Illness: ?Kiara Kent is a 61 y.o. RHD female who has been referred to clinic today by Gilford Silvius, FNP for evaluation of right shoulder pain.  She has pain in the lateral aspect of right shoulder for the past 9 months.  The pain started last summer, without a specific injury.  She kept expecting it to improve, and go away.  She has difficulty with overhead motion.  If she uses her arm, her pain gets worse.  She  states that she does not have worsening pain at night necessarily.  She is taking ibuprofen occasionally.  She has recently started working with physical therapy.  She has noted some improvements, but she is only been doing this for the past 2 weeks.  No prior injections. ? ? ?Review of Systems: ?No fevers or chills ?No numbness or tingling ?No chest pain ?No shortness of breath ?No bowel or bladder dysfunction ?No GI distress ?No headaches ? ? ?Medical History: ? ?Past Medical History:  ?Diagnosis Date  ? Anxiety   ? Diabetes mellitus without complication (HCC)   ? Hypercholesterolemia   ? Hypertension   ? ? ?Past Surgical History:  ?Procedure Laterality Date  ? CHOLECYSTECTOMY N/A 08/21/2019  ? Procedure: LAPAROSCOPIC CHOLECYSTECTOMY;  Surgeon: Lucretia Roers, MD;  Location: [facility];  Service: General;  Laterality: N/A;  ? TUBAL LIGATION    ? ? ?Family History  ?Problem Relation Age of Onset  ? Diabetes Mother   ? Multiple sclerosis Mother   ? Hypertension Father   ? Prostate cancer Father   ? Diabetes Maternal Grandmother   ? Heart disease Maternal Grandfather   ? ?Social History  ? ?Tobacco Use  ? Smoking status: Never  ? Smokeless tobacco: Never  ?Vaping Use  ? Vaping Use: Never used  ?Substance Use Topics  ? Alcohol use: Never  ? Drug use: Never  ? ? ?No  Known Allergies ? ?Current Meds  ?Medication Sig  ? diclofenac (VOLTAREN) 50 MG EC tablet Take 1 tablet (50 mg total) by mouth 2 (two) times daily.  ? ? ?Objective: ?Ht 5\' 9"  (1.753 m)   Wt 212 lb (96.2 kg)   BMI 31.31 kg/m?  ? ?Physical Exam: ? ?General: Alert and oriented. and No acute distress. ?Gait: Normal gait. ? ?Evaluation of the right shoulder demonstrates no deformity.  No atrophy is appreciated.  110 degrees of forward flexion, beyond this range of motion, she exhibits pain.  Internal rotation to lumbar spine, with obvious pain.  Passive external rotation is equal to the contralateral side.  Mild discomfort in the empty can testing position.   5/5 strength in the infraspinatus and supraspinatus.  Negative belly press.  Fingers are warm and well-perfused.  2+ radial pulse. ? ?IMAGING: ?I personally ordered and reviewed the following images ? ?X-rays of the right shoulder were obtained in clinic today.  No acute injuries are noted.  Mild loss of joint space within the inferior aspect of the glenohumeral joint.  Small osteophyte is appreciated off the inferior aspect of the glenoid.  No evidence of proximal humeral migration.  Minimal degenerative changes at the [facility] joint. ? ?Impression: Right shoulder x-rays with mild glenohumeral degenerative changes ? ? ?New Medications:  ?Meds ordered this encounter  ?Medications  ? diclofenac (VOLTAREN) 50 MG EC tablet  ?  Sig: Take 1 tablet (50 mg total) by mouth 2 (two) times daily.  ?  Dispense:  60 tablet  ?  Refill:  0  ? ? ? ? ?[facility], MD ? ?08/23/2021 ?2:33 PM ? ? ?

## 2021-08-24 ENCOUNTER — Ambulatory Visit: Payer: BC Managed Care – PPO | Admitting: Physical Therapy

## 2021-08-24 ENCOUNTER — Encounter: Payer: Self-pay | Admitting: Physical Therapy

## 2021-08-24 DIAGNOSIS — M25611 Stiffness of right shoulder, not elsewhere classified: Secondary | ICD-10-CM

## 2021-08-24 DIAGNOSIS — M25511 Pain in right shoulder: Secondary | ICD-10-CM | POA: Diagnosis not present

## 2021-08-24 DIAGNOSIS — G8929 Other chronic pain: Secondary | ICD-10-CM | POA: Diagnosis not present

## 2021-08-24 NOTE — Therapy (Addendum)
[facility] [place], [place], [address] Phone: [phone]   Fax:  [phone]  Physical Therapy Treatment  Patient Details  Name: Kiara Kent MRN: [id number] Date of Birth: 04-Sep-1960 Referring Provider (PT): Rakes, FNP   Encounter Date: 08/24/2021   PT End of Session - 08/24/21 1516     Visit Number 4    Number of Visits 8    Date for PT Re-Evaluation 09/29/21    PT Start Time 1779    PT Stop Time 1554    PT Time Calculation (min) 40 min    Activity Tolerance Patient tolerated treatment well    Behavior During Therapy [facility] for tasks assessed/performed             Past Medical History:  Diagnosis Date   Anxiety    Diabetes mellitus without complication ([place])    Hypercholesterolemia    Hypertension     Past Surgical History:  Procedure Laterality Date   CHOLECYSTECTOMY N/A 08/21/2019   Procedure: LAPAROSCOPIC CHOLECYSTECTOMY;  Surgeon: Virl Cagey, MD;  Location: [facility];  Service: General;  Laterality: N/A;   TUBAL LIGATION      There were no vitals filed for this visit.   Subjective Assessment - 08/24/21 1515     Subjective Recieved cortisone injection in R shoulder yesterday and reports being very sore. Does not return to work until Saturday.    Patient Stated Goals reduced pain, be able to use her arm throughout her work shift without increased pain    Currently in Pain? Yes    Pain Score 6     Pain Location Shoulder    Pain Orientation Right    Pain Descriptors / Indicators Sore    Pain Type Chronic pain    Pain Onset More than a month ago    Pain Frequency Constant                [facility] PT Assessment - 08/24/21 0001       Assessment   Medical Diagnosis Chronic right shoulder pain    Referring Provider (PT) Rakes, FNP    Hand Dominance Right    Next MD Visit 11/01/21    Prior Therapy No      Precautions   Precautions None                           [facility]  Adult PT Treatment/Exercise - 08/24/21 0001       Shoulder Exercises: Pulleys   Flexion 5 minutes      Shoulder Exercises: ROM/Strengthening   UBE (Upper Arm Bike) 120 RPM x8 min    Ranger seated; flex x2 min    Ball on Wall on table; flex, abd, circles      Manual Therapy   Manual Therapy Passive ROM    Passive ROM PROM of R shoulder into flex/ER/IR with holds at end range                          PT Long Term Goals - 08/15/21 1750       PT LONG TERM GOAL #1   Title Patient will be independent with her HEP.    Time 4    Period Weeks    Status New    Target Date 09/12/21      PT LONG TERM GOAL #2   Title Patient will be able to demonstrate at  least 120 degrees of active right shoulder flexion.    Time 4    Period Weeks    Status New    Target Date 09/12/21      PT LONG TERM GOAL #3   Title Patient will be able to reach behind her back with her familiar pain at a 3/10 or less for improved function getting dressed.    Time 4    Period Weeks    Status New    Target Date 09/12/21      PT LONG TERM GOAL #4   Title Patient will be able to demonstrate at least 120 degrees of active right shoulder abduction for improved function reaching overhead.    Time 4    Period Weeks    Status New    Target Date 09/12/21                   Plan - 08/24/21 1559     Clinical Impression Statement Patient presented in clinic with reports of increased R shoulder soreness after an injection yesterday. Lighter treatment completed today due to injection provided yesterday and consisted of AROM. Firm end feels and smooth arc of motion noted during PROM of R shoulder. Patient educated to ice as instructed by orthopedist and complete HEP.    Personal Factors and Comorbidities Comorbidity 2;Time since onset of injury/illness/exacerbation    Comorbidities HTN, DM    Examination-Activity Limitations Reach Overhead;Carry;Lift    Examination-Participation Restrictions  Occupation;Cleaning;Community Activity;Laundry    Stability/Clinical Decision Making Evolving/Moderate complexity    Rehab Potential Good    PT Frequency 2x / week    PT Duration 4 weeks    PT Treatment/Interventions Electrical Stimulation;Cryotherapy;Moist Heat;Neuromuscular re-education;Therapeutic exercise;Therapeutic activities;Patient/family education;Manual techniques;Dry needling;Passive range of motion;Taping;Vasopneumatic Device    PT Next Visit Plan UBE, pulleys, light rotator cuff strengthening, and modalities as needed    Consulted and Agree with Plan of Care Patient             Patient will benefit from skilled therapeutic intervention in order to improve the following deficits and impairments:  Decreased range of motion, Impaired UE functional use, Decreased activity tolerance, Pain, Hypomobility, Decreased strength  Visit Diagnosis: Chronic right shoulder pain  Stiffness of right shoulder, not elsewhere classified     Problem List Patient Active Problem List   Diagnosis Date Noted   Type 2 diabetes mellitus with other specified complication ([place]) 07/62/2633   Hyperlipidemia associated with type 2 diabetes mellitus ([place]) 08/02/2021   GAD (generalized anxiety disorder) 08/02/2021   Vitamin D deficiency 08/02/2021   BMI 31.0-31.9,adult 08/02/2021   Gastroesophageal reflux disease without esophagitis 08/02/2021   Irritable bowel syndrome with constipation 08/02/2021   Hypertension associated with diabetes ([place]) 02/21/2015   Iron deficiency anemia 11/30/2013    Standley Brooking, PTA 08/24/2021, 4:18 PM  [facility] [address] [place], [place], [address] Phone: [phone]   Fax:  [phone]  Name: Kiara Kent MRN: [id number] Date of Birth: 1960-06-22  PHYSICAL THERAPY DISCHARGE SUMMARY  Visits from Start of Care: 4  Current functional level related to goals / functional outcomes: Patient is being  discharged at this time as she has not returned to physical therapy since her last appointment.    Remaining deficits: Pain   Education / Equipment: HEP    Patient agrees to discharge. Patient goals were not met. Patient is being discharged due to not returning since the last visit.  Jacqulynn Cadet, PT, DPT

## 2021-08-29 ENCOUNTER — Ambulatory Visit: Payer: BC Managed Care – PPO | Admitting: Physical Therapy

## 2021-08-31 ENCOUNTER — Encounter: Payer: BC Managed Care – PPO | Admitting: Physical Therapy

## 2021-09-20 ENCOUNTER — Ambulatory Visit: Payer: BC Managed Care – PPO | Admitting: Orthopedic Surgery

## 2021-09-29 ENCOUNTER — Other Ambulatory Visit: Payer: Self-pay | Admitting: Orthopedic Surgery

## 2021-10-03 ENCOUNTER — Ambulatory Visit: Payer: BC Managed Care – PPO | Admitting: Orthopedic Surgery

## 2021-10-03 ENCOUNTER — Encounter: Payer: Self-pay | Admitting: Orthopedic Surgery

## 2021-10-03 VITALS — Ht 69.0 in | Wt 212.0 lb

## 2021-10-03 DIAGNOSIS — M7581 Other shoulder lesions, right shoulder: Secondary | ICD-10-CM | POA: Diagnosis not present

## 2021-10-03 NOTE — Progress Notes (Signed)
New Patient Visit ? ?Assessment: ?Kiara Kent is a 62 y.o. female with the following: ?1. Right rotator cuff tendinitis ? ?Plan: ?Kiara Kent has no pain in her right shoulder.  She has full range of motion.  She is no longer taking the medications.  If the pain returns, we can consider another injection.  I have advised her to take the medicines as needed.  She should also continue to work on her exercises.  No follow-up is needed at this time.  She will contact the clinic if she has any issues. ? ? ? ?Follow-up: ?Return if symptoms worsen or fail to improve. ? ?Subjective: ? ?Chief Complaint  ?Patient presents with  ? Shoulder Pain  ?  Rt shoulder pain doing better after injection   ? ? ?History of Present Illness: ?Kiara Kent is a 61 y.o. RHD female who returns to clinic for repeat evaluation of her right shoulder.  At the last visit, we injected her right shoulder.  Since then, she has been working with physical therapy, taking Voltaren gel and doing very well.  She reports no pain in her shoulder.  She has improved range of motion.  She is pleased with her progress today. ? ?Review of Systems: ?No fevers or chills ?No numbness or tingling ?No chest pain ?No shortness of breath ?No bowel or bladder dysfunction ?No GI distress ?No headaches ? ? ?Objective: ?Ht 5\' 9"  (1.753 m)   Wt 212 lb (96.2 kg)   BMI 31.31 kg/m?  ? ?Physical Exam: ? ?General: Alert and oriented. and No acute distress. ?Gait: Normal gait. ? ?Full range of motion of the right shoulder.  No pain.  5/5 strength in the right upper extremity.  Fingers warm and well-perfused.  2+ radial pulse. ? ?IMAGING: ?I personally ordered and reviewed the following images ? ?No new imaging obtained today.  ? ? ?New Medications:  ?No orders of the defined types were placed in this encounter. ? ? ? ? ? , MD ? ?10/03/2021 ?8:51 AM ? ? ?

## 2021-11-01 ENCOUNTER — Ambulatory Visit: Payer: BC Managed Care – PPO | Admitting: Family Medicine

## 2021-12-03 ENCOUNTER — Other Ambulatory Visit: Payer: Self-pay | Admitting: Orthopedic Surgery

## 2022-01-09 ENCOUNTER — Other Ambulatory Visit: Payer: Self-pay | Admitting: Family Medicine

## 2022-01-16 ENCOUNTER — Ambulatory Visit: Payer: BC Managed Care – PPO | Admitting: Family Medicine

## 2022-03-20 ENCOUNTER — Ambulatory Visit: Payer: BC Managed Care – PPO | Admitting: Family Medicine

## 2022-03-20 ENCOUNTER — Encounter: Payer: Self-pay | Admitting: Family Medicine

## 2022-03-20 VITALS — BP 147/91 | HR 99 | Temp 97.8°F | Ht 69.0 in | Wt 194.4 lb

## 2022-03-20 DIAGNOSIS — E1159 Type 2 diabetes mellitus with other circulatory complications: Secondary | ICD-10-CM

## 2022-03-20 DIAGNOSIS — Z23 Encounter for immunization: Secondary | ICD-10-CM

## 2022-03-20 DIAGNOSIS — E559 Vitamin D deficiency, unspecified: Secondary | ICD-10-CM | POA: Diagnosis not present

## 2022-03-20 DIAGNOSIS — E785 Hyperlipidemia, unspecified: Secondary | ICD-10-CM | POA: Diagnosis not present

## 2022-03-20 DIAGNOSIS — E1169 Type 2 diabetes mellitus with other specified complication: Secondary | ICD-10-CM | POA: Diagnosis not present

## 2022-03-20 DIAGNOSIS — Z1231 Encounter for screening mammogram for malignant neoplasm of breast: Secondary | ICD-10-CM | POA: Diagnosis not present

## 2022-03-20 DIAGNOSIS — I152 Hypertension secondary to endocrine disorders: Secondary | ICD-10-CM

## 2022-03-20 DIAGNOSIS — N762 Acute vulvitis: Secondary | ICD-10-CM

## 2022-03-20 LAB — BAYER DCA HB A1C WAIVED: HB A1C (BAYER DCA - WAIVED): 13.6 % — ABNORMAL HIGH (ref 4.8–5.6)

## 2022-03-20 MED ORDER — METFORMIN HCL ER (OSM) 500 MG PO TB24: 500.0000 mg | ORAL_TABLET | Freq: Every day | ORAL | 3 refills | Status: DC

## 2022-03-20 MED ORDER — SEMAGLUTIDE(0.25 OR 0.5MG/DOS) 2 MG/3ML ~~LOC~~ SOPN: 0.2500 mg | PEN_INJECTOR | SUBCUTANEOUS | 3 refills | Status: DC

## 2022-03-20 MED ORDER — NYSTATIN 100000 UNIT/GM EX CREA: 1.0000 | TOPICAL_CREAM | Freq: Two times a day (BID) | CUTANEOUS | 0 refills | Status: DC

## 2022-03-20 MED ORDER — AMLODIPINE BESYLATE 5 MG PO TABS: 5.0000 mg | ORAL_TABLET | Freq: Every day | ORAL | 1 refills | Status: DC

## 2022-03-20 MED ORDER — TRIAMCINOLONE ACETONIDE 0.1 % EX CREA: 1.0000 | TOPICAL_CREAM | Freq: Two times a day (BID) | CUTANEOUS | 0 refills | Status: DC

## 2022-03-20 MED ORDER — LISINOPRIL 20 MG PO TABS: 20.0000 mg | ORAL_TABLET | Freq: Every day | ORAL | 1 refills | Status: DC

## 2022-03-20 NOTE — Progress Notes (Signed)
Subjective:  Patient ID: Kiara Kent, female    DOB: 04-29-1961, 61 y.o.   MRN: [id number]  Patient Care Team: Baruch Gouty, FNP as PCP - General (Family Medicine)   Chief Complaint:  Medical Management of Chronic Issues and Blister (Vaginal area after having a reaction to new body wash )   HPI: Kiara Kent is a 61 y.o. female presenting on 03/20/2022 for Medical Management of Chronic Issues and Blister (Vaginal area after having a reaction to new body wash )   1. Type 2 diabetes mellitus with other specified complication, without long-term current use of insulin ([place]) Patient reports continued noncompliance with metformin.  States she will take it every now and then.  She does not check her blood sugar at home and denies any polyuria, polydipsia, or polyphagia.  She does acknowledge excessive [facility] and sweet tea intake.  She does not follow a regular diet for an exercise routine.  She denies any neuropathy symptoms. Patient was to follow-up in June and did not.  2. Hyperlipidemia associated with type 2 diabetes mellitus ([place]) Reports compliance with statin therapy, no associated myalgias.  Does not follow a diet or exercise routine.  3. Hypertension associated with diabetes ([place]) Reports compliance with both lisinopril and amlodipine.  Denies chest pain, leg swelling, headaches, visual changes, weakness, confusion, or syncope.  She does not monitor her salt intake, follow a diet or an exercise routine.  4. Vitamin D deficiency Compliant with repletion therapy.  Denies arthralgias, recent fractures, or difficulty walking.  Patient has not had her mammogram and would like to schedule this.  She has her eye exam scheduled for next month.  She has Cologuard kit at home and states she will complete this.  She reports recently using a new bath and body body wash in her genital area.  States since this time she has had a rash with raised areas and blistering which is very  pruritic.  No reported vaginal discharge or bleeding.  No recent sexual encounters.   Relevant past medical, surgical, family, and social history reviewed and updated as indicated.  Allergies and medications reviewed and updated. Data reviewed: Chart in Epic.   Past Medical History:  Diagnosis Date   Anxiety    Diabetes mellitus without complication ([place])    Hypercholesterolemia    Hypertension     Past Surgical History:  Procedure Laterality Date   CHOLECYSTECTOMY N/A 08/21/2019   Procedure: LAPAROSCOPIC CHOLECYSTECTOMY;  Surgeon: Virl Cagey, MD;  Location: [facility];  Service: General;  Laterality: N/A;   TUBAL LIGATION      Social History   Socioeconomic History   Marital status: Single    Spouse name: Not on file   Number of children: Not on file   Years of education: Not on file   Highest education level: Not on file  Occupational History   Not on file  Tobacco Use   Smoking status: Never   Smokeless tobacco: Never  Vaping Use   Vaping Use: Never used  Substance and Sexual Activity   Alcohol use: Never   Drug use: Never   Sexual activity: Yes    Birth control/protection: Surgical  Other Topics Concern   Not on file  Social History Narrative   Not on file   Social Determinants of Health   Financial Resource Strain: Not on file  Food Insecurity: Not on file  Transportation Needs: Not on file  Physical Activity: Not on file  Stress:  Not on file  Social Connections: Not on file  Intimate Partner Violence: Not on file    Outpatient Encounter Medications as of 03/20/2022  Medication Sig   buPROPion (WELLBUTRIN XL) 150 MG 24 hr tablet Take 150 mg by mouth every morning.   diclofenac (VOLTAREN) 50 MG EC tablet TAKE 1 TABLET BY MOUTH TWICE A DAY   metformin (FORTAMET) 500 MG (OSM) 24 hr tablet Take 1 tablet (500 mg total) by mouth daily with breakfast.   nystatin cream (MYCOSTATIN) Apply 1 Application topically 2 (two) times daily.   Semaglutide,0.25 or  0.5MG/DOS, 2 MG/3ML SOPN Inject 0.25 mg into the skin once a week.   simvastatin (ZOCOR) 40 MG tablet Take 40 mg by mouth daily.   triamcinolone cream (KENALOG) 0.1 % Apply 1 Application topically 2 (two) times daily.   Vitamin D, Ergocalciferol, (DRISDOL) 1.25 MG (50000 UNIT) CAPS capsule Take 50,000 Units by mouth once a week.   [DISCONTINUED] amLODipine (NORVASC) 5 MG tablet Take 5 mg by mouth daily.   [DISCONTINUED] lisinopril (ZESTRIL) 20 MG tablet TAKE ONE TABLET BY MOUTH DAILY.   [DISCONTINUED] metFORMIN (GLUCOPHAGE) 500 MG tablet Take 1 tablet (500 mg total) by mouth 2 (two) times daily with a meal.   amLODipine (NORVASC) 5 MG tablet Take 1 tablet (5 mg total) by mouth daily.   lisinopril (ZESTRIL) 20 MG tablet Take 1 tablet (20 mg total) by mouth daily.   [DISCONTINUED] metFORMIN (GLUCOPHAGE) 500 MG tablet Take 500 mg by mouth 2 (two) times daily.   No facility-administered encounter medications on file as of 03/20/2022.    No Known Allergies  Review of Systems  Constitutional:  Negative for activity change, appetite change, chills, diaphoresis, fatigue, fever and unexpected weight change.  HENT: Negative.    Eyes: Negative.  Negative for photophobia and visual disturbance.  Respiratory:  Negative for cough, chest tightness and shortness of breath.   Cardiovascular:  Negative for chest pain, palpitations and leg swelling.  Gastrointestinal:  Negative for abdominal pain, blood in stool, constipation, diarrhea, nausea and vomiting.  Endocrine: Negative.  Negative for cold intolerance, heat intolerance, polydipsia, polyphagia and polyuria.  Genitourinary:  Positive for vaginal pain. Negative for decreased urine volume, difficulty urinating, dysuria, frequency, urgency, vaginal bleeding and vaginal discharge.  Musculoskeletal:  Negative for arthralgias and myalgias.  Skin:  Positive for rash.  Allergic/Immunologic: Negative.   Neurological:  Negative for dizziness, tremors, seizures,  syncope, facial asymmetry, speech difficulty, weakness, light-headedness, numbness and headaches.  Hematological: Negative.   Psychiatric/Behavioral:  Negative for confusion, hallucinations, sleep disturbance and suicidal ideas.   All other systems reviewed and are negative.       Objective:  BP (!) 147/91   Pulse 99   Temp 97.8 F (36.6 C) (Temporal)   Ht _0  (1.753 m)   Wt 194 lb 6.4 oz (88.2 kg)   SpO2 97%   BMI 28.71 kg/m    Wt Readings from Last 3 Encounters:  03/20/22 194 lb 6.4 oz (88.2 kg)  10/03/21 212 lb (96.2 kg)  08/23/21 212 lb (96.2 kg)    Physical Exam Vitals and nursing note reviewed. Exam conducted with a chaperone present.  Constitutional:      General: She is not in acute distress.    Appearance: Normal appearance. She is well-developed, well-groomed and overweight. She is not ill-appearing, toxic-appearing or diaphoretic.  HENT:     Head: Normocephalic and atraumatic.     Jaw: There is normal jaw occlusion.  Right Ear: Hearing normal.     Left Ear: Hearing normal.     Nose: Nose normal.     Mouth/Throat:     Lips: Pink.     Mouth: Mucous membranes are moist.     Pharynx: Oropharynx is clear. Uvula midline.  Eyes:     General: Lids are normal.     Extraocular Movements: Extraocular movements intact.     Conjunctiva/sclera: Conjunctivae normal.     Pupils: Pupils are equal, round, and reactive to light.  Neck:     Thyroid: No thyroid mass, thyromegaly or thyroid tenderness.     Vascular: No carotid bruit or JVD.     Trachea: Trachea and phonation normal.  Cardiovascular:     Rate and Rhythm: Normal rate and regular rhythm.     Chest Wall: PMI is not displaced.     Pulses: Normal pulses.     Heart sounds: Normal heart sounds. No murmur heard.    No friction rub. No gallop.  Pulmonary:     Effort: Pulmonary effort is normal. No respiratory distress.     Breath sounds: Normal breath sounds. No wheezing.  Abdominal:     General: Bowel  sounds are normal. There is no distension or abdominal bruit.     Palpations: Abdomen is soft. There is no hepatomegaly or splenomegaly.     Tenderness: There is no abdominal tenderness. There is no right CVA tenderness or left CVA tenderness.     Hernia: No hernia is present.  Genitourinary:    Exam position: Knee-chest position.     Labia:        Right: Rash present. No tenderness, lesion or injury.        Left: Rash present. No tenderness, lesion or injury.      Urethra: No prolapse, urethral swelling or urethral lesion.     Comments: Red beefy rash to bilateral vulva/labia with satellite lesions.  No vaginal discharge or drainage.  No open or draining wounds. Musculoskeletal:        General: Normal range of motion.     Cervical back: Normal range of motion and neck supple.     Right lower leg: No edema.     Left lower leg: No edema.  Feet:     Right foot:     Toenail Condition: Right toenails are abnormally thick.     Left foot:     Toenail Condition: Left toenails are abnormally thick.  Lymphadenopathy:     Cervical: No cervical adenopathy.  Skin:    General: Skin is warm and dry.     Capillary Refill: Capillary refill takes less than 2 seconds.     Coloration: Skin is not cyanotic, jaundiced or pale.     Findings: No rash.  Neurological:     General: No focal deficit present.     Mental Status: She is alert and oriented to person, place, and time.     Sensory: Sensation is intact.     Motor: Motor function is intact.     Coordination: Coordination is intact.     Gait: Gait is intact.     Deep Tendon Reflexes: Reflexes are normal and symmetric.  Psychiatric:        Attention and Perception: Attention and perception normal.        Mood and Affect: Mood and affect normal.        Speech: Speech normal.        Behavior: Behavior normal. Behavior is cooperative.  Thought Content: Thought content normal.        Cognition and Memory: Cognition and memory normal.         Judgment: Judgment normal.     Results for orders placed or performed in visit on 03/20/22  Bayer DCA Hb A1c Waived  Result Value Ref Range   HB A1C (BAYER DCA - WAIVED) 13.6 (H) 4.8 - 5.6 %       Pertinent labs & imaging results that were available during my care of the patient were reviewed by me and considered in my medical decision making.  Assessment & Plan:  Lisabeth was seen today for medical management of chronic issues and blister.  Diagnoses and all orders for this visit:  Type 2 diabetes mellitus with other specified complication, without long-term current use of insulin ([place]) A1C 13.6 in office today.  Compliance with medications discussed in detail.  Will switch metformin to long-acting to see if better tolerated.  We will add Ozempic to current regimen.  Diet and exercise discussed in detail.  Endorgan damage discussed in detail.  Patient aware compliance is necessary to prevent endorgan damage from occurring.  She has an upcoming appointment with podiatry and ophthalmology. -     Bayer DCA Hb A1c Waived -     Semaglutide,0.25 or 0.5MG/DOS, 2 MG/3ML SOPN; Inject 0.25 mg into the skin once a week. -     metformin (FORTAMET) 500 MG (OSM) 24 hr tablet; Take 1 tablet (500 mg total) by mouth daily with breakfast.  Hyperlipidemia associated with type 2 diabetes mellitus ([place]) Diet encouraged - increase intake of fresh fruits and vegetables, increase intake of lean proteins. Bake, broil, or grill foods. Avoid fried, greasy, and fatty foods. Avoid fast foods. Increase intake of fiber-rich whole grains. Exercise encouraged - at least 150 minutes per week and advance as tolerated.  Goal BMI < 25. Continue medications as prescribed. Follow up in 3-6 months as discussed.  -     Lipid panel  Hypertension associated with diabetes [facility]) Patient reports she has been out of her amlodipine for 2 weeks and would like to restart this.  Blood pressure minimally elevated in the office today.  Aware  to follow DASH diet along with exercise. -     CBC with Differential/Platelet -     CMP14+EGFR -     lisinopril (ZESTRIL) 20 MG tablet; Take 1 tablet (20 mg total) by mouth daily. -     amLODipine (NORVASC) 5 MG tablet; Take 1 tablet (5 mg total) by mouth daily.  Vitamin D deficiency Labs pending. Continue repletion therapy. If indicated, will change repletion dosage. Eat foods rich in Vit D including milk, orange juice, yogurt with vitamin D added, salmon or mackerel, canned tuna fish, cereals with vitamin D added, and cod liver oil. Get out in the sun but make sure to wear at least SPF 30 sunscreen.  -     VITAMIN D 25 Hydroxy (Vit-D Deficiency, Fractures)  Encounter for screening mammogram for malignant neoplasm of breast Mammogram ordered. -     MM 3D SCREEN BREAST BILATERAL; Future  Acute vulvitis Classic yeast vulvitis.  Discussed proper vaginal hygiene and control of diabetes to prevent recurrent infection.  Will treat with below.  Patient aware of symptomatic care at home.  Report new, worsening, or persistent symptoms. -     triamcinolone cream (KENALOG) 0.1 %; Apply 1 Application topically 2 (two) times daily. -     nystatin cream (MYCOSTATIN); Apply 1  Application topically 2 (two) times daily.  Need for immunization against influenza -     Flu Vaccine QUAD 28moIM (Fluarix, Fluzone & Alfiuria Quad PF)     Continue all other maintenance medications.  Follow up plan: Return in about 3 months (around 06/20/2022) for CPE with PAP.   Continue healthy lifestyle choices, including diet (rich in fruits, vegetables, and lean proteins, and low in salt and simple carbohydrates) and exercise (at least 30 minutes of moderate physical activity daily).  Educational handout given for DM, vulvitis  The above assessment and management plan was discussed with the patient. The patient verbalized understanding of and has agreed to the management plan. Patient is aware to call the clinic if they  develop any new symptoms or if symptoms persist or worsen. Patient is aware when to return to the clinic for a follow-up visit. Patient educated on when it is appropriate to go to the emergency department.   MMonia Pouch FNP-C [facility] [phone]

## 2022-03-20 NOTE — Patient Instructions (Addendum)

## 2022-03-21 LAB — CMP14+EGFR
ALT: 15 IU/L (ref 0–32)
AST: 12 IU/L (ref 0–40)
Albumin/Globulin Ratio: 1.3 (ref 1.2–2.2)
Albumin: 4.5 g/dL (ref 3.8–4.9)
Alkaline Phosphatase: 77 IU/L (ref 44–121)
BUN/Creatinine Ratio: 14 (ref 9–23)
BUN: 11 mg/dL (ref 6–24)
Bilirubin Total: 0.3 mg/dL (ref 0.0–1.2)
CO2: 25 mmol/L (ref 20–29)
Calcium: 9.5 mg/dL (ref 8.7–10.2)
Chloride: 95 mmol/L — ABNORMAL LOW (ref 96–106)
Creatinine, Ser: 0.8 mg/dL (ref 0.57–1.00)
Globulin, Total: 3.6 g/dL (ref 1.5–4.5)
Glucose: 288 mg/dL — ABNORMAL HIGH (ref 70–99)
Potassium: 3.9 mmol/L (ref 3.5–5.2)
Sodium: 135 mmol/L (ref 134–144)
Total Protein: 8.1 g/dL (ref 6.0–8.5)
eGFR: 85 mL/min/{1.73_m2} (ref >59)

## 2022-03-21 LAB — CBC WITH DIFFERENTIAL/PLATELET
Basophils Absolute: 0 10*3/uL (ref 0.0–0.2)
Basos: 0 %
EOS (ABSOLUTE): 0.1 10*3/uL (ref 0.0–0.4)
Eos: 1 %
Hematocrit: 36.2 % (ref 34.0–46.6)
Hemoglobin: 11.9 g/dL (ref 11.1–15.9)
Immature Grans (Abs): 0 10*3/uL (ref 0.0–0.1)
Immature Granulocytes: 0 %
Lymphocytes Absolute: 1.9 10*3/uL (ref 0.7–3.1)
Lymphs: 42 %
MCH: 30.2 pg (ref 26.6–33.0)
MCHC: 32.9 g/dL (ref 31.5–35.7)
MCV: 92 fL (ref 79–97)
Monocytes Absolute: 0.3 10*3/uL (ref 0.1–0.9)
Monocytes: 7 %
Neutrophils Absolute: 2.2 10*3/uL (ref 1.4–7.0)
Neutrophils: 50 %
Platelets: 294 10*3/uL (ref 150–450)
RBC: 3.94 x10E6/uL (ref 3.77–5.28)
RDW: 11.9 % (ref 11.7–15.4)
WBC: 4.4 10*3/uL (ref 3.4–10.8)

## 2022-03-21 LAB — LIPID PANEL
Chol/HDL Ratio: 4.4 ratio (ref 0.0–4.4)
Cholesterol, Total: 217 mg/dL — ABNORMAL HIGH (ref 100–199)
HDL: 49 mg/dL (ref >39)
LDL Chol Calc (NIH): 140 mg/dL — ABNORMAL HIGH (ref 0–99)
Triglycerides: 154 mg/dL — ABNORMAL HIGH (ref 0–149)
VLDL Cholesterol Cal: 28 mg/dL (ref 5–40)

## 2022-03-21 LAB — VITAMIN D 25 HYDROXY (VIT D DEFICIENCY, FRACTURES): Vit D, 25-Hydroxy: 25.9 ng/mL — ABNORMAL LOW (ref 30.0–100.0)

## 2022-03-22 ENCOUNTER — Telehealth: Payer: Self-pay

## 2022-03-22 NOTE — Telephone Encounter (Signed)
Key: [id number] help? Call us at [phone] Outcome Approvedtoday Effective from 03/22/2022 through 03/21/2023. Drug metFORMIN HCl ER (OSM) 500MG  er tablets Form Blue Building control surveyor Form (CB)

## 2022-03-22 NOTE — Telephone Encounter (Signed)
Needing PA for metformin (FORTAMET) 500 MG (OSM) 24 hr tablet [[id number] (Request key: [id number])    Can it be changed to regular metformin? If so please change.

## 2022-03-23 NOTE — Telephone Encounter (Signed)
Pharmacy aware

## 2022-03-26 ENCOUNTER — Other Ambulatory Visit: Payer: Self-pay | Admitting: Family Medicine

## 2022-04-13 ENCOUNTER — Other Ambulatory Visit: Payer: Self-pay | Admitting: Family Medicine

## 2022-04-13 ENCOUNTER — Telehealth: Payer: Self-pay | Admitting: Family Medicine

## 2022-04-13 DIAGNOSIS — N762 Acute vulvitis: Secondary | ICD-10-CM

## 2022-04-13 MED ORDER — FLUCONAZOLE 150 MG PO TABS: 150.0000 mg | ORAL_TABLET | Freq: Once | ORAL | 0 refills | Status: AC

## 2022-04-13 NOTE — Telephone Encounter (Signed)
Patient aware and verbalizes understanding. 

## 2022-04-13 NOTE — Telephone Encounter (Signed)
Acute vulvitis Classic yeast vulvitis.  Discussed proper vaginal hygiene and control of diabetes to prevent recurrent infection.  Will treat with below.  Patient aware of symptomatic care at home.  Report new, worsening, or persistent symptoms. -     triamcinolone cream (KENALOG) 0.1 %; Apply 1 Application topically 2 (two) times daily. -     nystatin cream (MYCOSTATIN); Apply 1 Application topically 2 (two) times daily.    Patient states she has used up all the cream and still having a rash. Please advise

## 2022-04-16 DIAGNOSIS — H2513 Age-related nuclear cataract, bilateral: Secondary | ICD-10-CM | POA: Diagnosis not present

## 2022-04-16 DIAGNOSIS — H52223 Regular astigmatism, bilateral: Secondary | ICD-10-CM | POA: Diagnosis not present

## 2022-04-16 DIAGNOSIS — H524 Presbyopia: Secondary | ICD-10-CM | POA: Diagnosis not present

## 2022-04-16 DIAGNOSIS — H5203 Hypermetropia, bilateral: Secondary | ICD-10-CM | POA: Diagnosis not present

## 2022-04-16 LAB — HM DIABETES EYE EXAM

## 2022-05-17 ENCOUNTER — Telehealth: Payer: Self-pay | Admitting: Family Medicine

## 2022-05-17 ENCOUNTER — Other Ambulatory Visit: Payer: Self-pay | Admitting: Family Medicine

## 2022-05-17 DIAGNOSIS — N762 Acute vulvitis: Secondary | ICD-10-CM

## 2022-05-17 NOTE — Telephone Encounter (Signed)
Pt aware.

## 2022-05-17 NOTE — Telephone Encounter (Signed)
REFERRAL REQUEST Telephone Note  Have you been seen at our office for this problem? yes (Advise that they may need an appointment with their PCP before a referral can be done)  Reason for Referral: gyn issues Referral discussed with patient: yes  Best contact number of patient for referral team: [phone]    Has patient been seen by a specialist for this issue before: no  Patient provider preference for referral: na Patient location preference for referral: na   Patient notified that referrals can take up to a week or longer to process. If they haven't heard anything within a week they should call back and speak with the referral department.

## 2022-06-07 ENCOUNTER — Other Ambulatory Visit (HOSPITAL_COMMUNITY)
Admission: RE | Admit: 2022-06-07 | Discharge: 2022-06-07 | Disposition: A | Discharge status: Home / Self Care | Payer: BC Managed Care – PPO | Source: Ambulatory Visit | Attending: Obstetrics & Gynecology | Admitting: Obstetrics & Gynecology

## 2022-06-07 ENCOUNTER — Encounter: Payer: Self-pay | Admitting: Obstetrics & Gynecology

## 2022-06-07 ENCOUNTER — Ambulatory Visit (INDEPENDENT_AMBULATORY_CARE_PROVIDER_SITE_OTHER): Payer: BC Managed Care – PPO | Admitting: Obstetrics & Gynecology

## 2022-06-07 VITALS — BP 134/85 | HR 84 | Ht 69.0 in | Wt 187.5 lb

## 2022-06-07 DIAGNOSIS — E1169 Type 2 diabetes mellitus with other specified complication: Secondary | ICD-10-CM | POA: Diagnosis not present

## 2022-06-07 DIAGNOSIS — B3731 Acute candidiasis of vulva and vagina: Secondary | ICD-10-CM | POA: Diagnosis not present

## 2022-06-07 DIAGNOSIS — Z124 Encounter for screening for malignant neoplasm of cervix: Secondary | ICD-10-CM

## 2022-06-07 MED ORDER — FLUCONAZOLE 100 MG PO TABS: 100.0000 mg | ORAL_TABLET | Freq: Every day | ORAL | 0 refills | Status: DC

## 2022-06-07 NOTE — Progress Notes (Signed)
Chief Complaint  Patient presents with   Vaginal Itching    Has noticed 2 blisters that itch also; needs pap      62 y.o. [id number] No LMP recorded. Patient is postmenopausal. The current method of family planning is post menopausal status.  Outpatient Encounter Medications as of 06/07/2022  Medication Sig   amLODipine (NORVASC) 5 MG tablet Take 1 tablet (5 mg total) by mouth daily.   buPROPion (WELLBUTRIN XL) 150 MG 24 hr tablet TAKE 1 TABLET BY MOUTH EVERY DAY IN THE MORNING   diclofenac (VOLTAREN) 50 MG EC tablet TAKE 1 TABLET BY MOUTH TWICE A DAY   fluconazole (DIFLUCAN) 100 MG tablet Take 1 tablet (100 mg total) by mouth daily.   lisinopril (ZESTRIL) 20 MG tablet Take 1 tablet (20 mg total) by mouth daily.   metformin (FORTAMET) 500 MG (OSM) 24 hr tablet Take 1 tablet (500 mg total) by mouth daily with breakfast.   simvastatin (ZOCOR) 40 MG tablet Take 40 mg by mouth daily.   Vitamin D, Ergocalciferol, (DRISDOL) 1.25 MG (50000 UNIT) CAPS capsule Take 50,000 Units by mouth once a week.   Semaglutide,0.25 or 0.5MG /DOS, 2 MG/3ML SOPN Inject 0.25 mg into the skin once a week. (Patient not taking: Reported on 06/07/2022)   [DISCONTINUED] nystatin cream (MYCOSTATIN) Apply 1 Application topically 2 (two) times daily.   [DISCONTINUED] triamcinolone cream (KENALOG) 0.1 % Apply 1 Application topically 2 (two) times daily.   No facility-administered encounter medications on file as of 06/07/2022.    Subjective Pt with 3 months of an itchy bottom Never had this before, has 2 ulcers as well A1C 12-->starting ozempic, has been on metformin No odor No recent antibiotics Past Medical History:  Diagnosis Date   Anxiety    Diabetes mellitus without complication ([place])    Hypercholesterolemia    Hypertension     Past Surgical History:  Procedure Laterality Date   CHOLECYSTECTOMY N/A 08/21/2019   Procedure: LAPAROSCOPIC CHOLECYSTECTOMY;  Surgeon: Virl Cagey, MD;  Location: [facility];  Service: General;  Laterality: N/A;   TUBAL LIGATION      OB History     Gravida  5   Para  5   Term  5   Preterm      AB      Living  5      SAB      IAB      Ectopic      Multiple      Live Births  5           No Known Allergies  Social History   Socioeconomic History   Marital status: Single    Spouse name: Not on file   Number of children: Not on file   Years of education: Not on file   Highest education level: Not on file  Occupational History   Not on file  Tobacco Use   Smoking status: Never   Smokeless tobacco: Never  Vaping Use   Vaping Use: Never used  Substance and Sexual Activity   Alcohol use: Yes    Comment: monthly   Drug use: Never   Sexual activity: Not Currently    Birth control/protection: Surgical    Comment: tubal  Other Topics Concern   Not on file  Social History Narrative   Not on file   Social Determinants of Health   Financial Resource Strain: Low Risk  (06/07/2022)   Overall Financial Resource Strain (CARDIA)  Difficulty of Paying Living Expenses: Not hard at all  Food Insecurity: No Food Insecurity (06/07/2022)   Hunger Vital Sign    Worried About Running Out of Food in the Last Year: Never true    Ran Out of Food in the Last Year: Never true  Transportation Needs: No Transportation Needs (06/07/2022)   PRAPARE - Hydrologist (Medical): No    Lack of Transportation (Non-Medical): No  Physical Activity: Insufficiently Active (06/07/2022)   Exercise Vital Sign    Days of Exercise per Week: 3 days    Minutes of Exercise per Session: 30 min  Stress: No Stress Concern Present (06/07/2022)   [place]    Feeling of Stress : Not at all  Social Connections: Unknown (06/07/2022)   Social Connection and Isolation Panel [NHANES]    Frequency of Communication with Friends and Family: More than three times a week    Frequency  of Social Gatherings with Friends and Family: Three times a week    Attends Religious Services: Patient refused    Active Member of Clubs or Organizations: Yes    Attends Archivist Meetings: Patient refused    Marital Status: Divorced    Family History  Problem Relation Age of Onset   Diabetes Maternal Grandmother    Heart disease Maternal Grandfather    Hypertension Father    Prostate cancer Father    Diabetes Mother    Multiple sclerosis Mother     Medications:       Current Outpatient Medications:    amLODipine (NORVASC) 5 MG tablet, Take 1 tablet (5 mg total) by mouth daily., Disp: 90 tablet, Rfl: 1   buPROPion (WELLBUTRIN XL) 150 MG 24 hr tablet, TAKE 1 TABLET BY MOUTH EVERY DAY IN THE MORNING, Disp: 90 tablet, Rfl: 0   diclofenac (VOLTAREN) 50 MG EC tablet, TAKE 1 TABLET BY MOUTH TWICE A DAY, Disp: 60 tablet, Rfl: 0   fluconazole (DIFLUCAN) 100 MG tablet, Take 1 tablet (100 mg total) by mouth daily., Disp: 30 tablet, Rfl: 0   lisinopril (ZESTRIL) 20 MG tablet, Take 1 tablet (20 mg total) by mouth daily., Disp: 90 tablet, Rfl: 1   metformin (FORTAMET) 500 MG (OSM) 24 hr tablet, Take 1 tablet (500 mg total) by mouth daily with breakfast., Disp: 90 tablet, Rfl: 3   simvastatin (ZOCOR) 40 MG tablet, Take 40 mg by mouth daily., Disp: , Rfl:    Vitamin D, Ergocalciferol, (DRISDOL) 1.25 MG (50000 UNIT) CAPS capsule, Take 50,000 Units by mouth once a week., Disp: , Rfl:    Semaglutide,0.25 or 0.5MG /DOS, 2 MG/3ML SOPN, Inject 0.25 mg into the skin once a week. (Patient not taking: Reported on 06/07/2022), Disp: 3 mL, Rfl: 3  Objective Blood pressure 134/85, pulse 84, height 5\' 9"  (1.753 m), weight 187 lb 8 oz (85 kg).  Severe candidal vulvitis with some ulceration Painted with gentian violet Bimanual is negative Pap is done  Pertinent ROS No burning with urination, frequency or urgency No nausea, vomiting or diarrhea Nor fever chills or other constitutional  symptoms   Labs or studies     Impression + Management Plan: Diagnoses this Encounter::   ICD-10-CM   1. Candidal vulvitis  B37.31     2. Type 2 diabetes mellitus with other specified complication, unspecified whether long term insulin use ([facility])  E11.69    on metformin, going to start ozempic    3. Routine  Papanicolaou smear  Z12.4 Cytology - PAP( [place])        Medications prescribed during  this encounter: Meds ordered this encounter  Medications   fluconazole (DIFLUCAN) 100 MG tablet    Sig: Take 1 tablet (100 mg total) by mouth daily.    Dispense:  30 tablet    Refill:  0    Labs or Scans Ordered during this encounter: No orders of the defined types were placed in this encounter.     Follow up Return in about 1 month (around 07/08/2022) for Follow up, with Dr Despina Hidden.

## 2022-06-12 LAB — CYTOLOGY - PAP
Comment: NEGATIVE
Diagnosis: UNDETERMINED — AB
High risk HPV: NEGATIVE

## 2022-06-20 ENCOUNTER — Encounter: Payer: BC Managed Care – PPO | Admitting: Family Medicine

## 2022-06-23 ENCOUNTER — Other Ambulatory Visit: Payer: Self-pay | Admitting: Family Medicine

## 2022-06-23 DIAGNOSIS — I152 Hypertension secondary to endocrine disorders: Secondary | ICD-10-CM

## 2022-07-05 ENCOUNTER — Ambulatory Visit (INDEPENDENT_AMBULATORY_CARE_PROVIDER_SITE_OTHER): Payer: BC Managed Care – PPO | Admitting: Obstetrics & Gynecology

## 2022-07-05 ENCOUNTER — Encounter: Payer: Self-pay | Admitting: Obstetrics & Gynecology

## 2022-07-05 VITALS — BP 135/83 | HR 84 | Ht 69.0 in | Wt 187.0 lb

## 2022-07-05 DIAGNOSIS — B9689 Other specified bacterial agents as the cause of diseases classified elsewhere: Secondary | ICD-10-CM | POA: Diagnosis not present

## 2022-07-05 DIAGNOSIS — B3731 Acute candidiasis of vulva and vagina: Secondary | ICD-10-CM

## 2022-07-05 DIAGNOSIS — N760 Acute vaginitis: Secondary | ICD-10-CM

## 2022-07-05 MED ORDER — NUVESSA 1.3 % VA GEL: 1.0000 | Freq: Once | VAGINAL | 1 refills | Status: AC

## 2022-07-05 NOTE — Progress Notes (Signed)
Chief Complaint  Patient presents with   Follow-up    Doing better after taking Diflucan and Ozempic      62 y.o. [id number] No LMP recorded. Patient is postmenopausal. The current method of family planning is menopausal.  Outpatient Encounter Medications as of 07/05/2022  Medication Sig   amLODipine (NORVASC) 5 MG tablet Take 1 tablet (5 mg total) by mouth daily.   buPROPion (WELLBUTRIN XL) 150 MG 24 hr tablet TAKE 1 TABLET BY MOUTH EVERY DAY IN THE MORNING   diclofenac (VOLTAREN) 50 MG EC tablet TAKE 1 TABLET BY MOUTH TWICE A DAY   fluconazole (DIFLUCAN) 100 MG tablet Take 1 tablet (100 mg total) by mouth daily.   lisinopril (ZESTRIL) 20 MG tablet Take 1 tablet (20 mg total) by mouth daily.   metformin (FORTAMET) 500 MG (OSM) 24 hr tablet Take 1 tablet (500 mg total) by mouth daily with breakfast.   NUVESSA 1.3 % GEL Place 1 applicator vaginally once for 1 dose.   Semaglutide,0.25 or 0.5MG /DOS, 2 MG/3ML SOPN Inject 0.25 mg into the skin once a week.   simvastatin (ZOCOR) 40 MG tablet Take 40 mg by mouth daily.   Vitamin D, Ergocalciferol, (DRISDOL) 1.25 MG (50000 UNIT) CAPS capsule Take 50,000 Units by mouth once a week.   No facility-administered encounter medications on file as of 07/05/2022.    Subjective Pt feels "100% better" has a couple days of diflucan left No complaints today  Past Medical History:  Diagnosis Date   Anxiety    Diabetes mellitus without complication ([place])    Hypercholesterolemia    Hypertension     Past Surgical History:  Procedure Laterality Date   CHOLECYSTECTOMY N/A 08/21/2019   Procedure: LAPAROSCOPIC CHOLECYSTECTOMY;  Surgeon: Virl Cagey, MD;  Location: [facility];  Service: General;  Laterality: N/A;   TUBAL LIGATION      OB History     Gravida  5   Para  5   Term  5   Preterm      AB      Living  5      SAB      IAB      Ectopic      Multiple      Live Births  5           No Known Allergies  Social  History   Socioeconomic History   Marital status: Single    Spouse name: Not on file   Number of children: Not on file   Years of education: Not on file   Highest education level: Not on file  Occupational History   Not on file  Tobacco Use   Smoking status: Never   Smokeless tobacco: Never  Vaping Use   Vaping Use: Never used  Substance and Sexual Activity   Alcohol use: Yes    Comment: monthly   Drug use: Never   Sexual activity: Not Currently    Birth control/protection: Surgical    Comment: tubal  Other Topics Concern   Not on file  Social History Narrative   Not on file   Social Determinants of Health   Financial Resource Strain: Low Risk  (06/07/2022)   Overall Financial Resource Strain (CARDIA)    Difficulty of Paying Living Expenses: Not hard at all  Food Insecurity: No Food Insecurity (06/07/2022)   Hunger Vital Sign    Worried About Running Out of Food in the Last Year: Never true  Ran Out of Food in the Last Year: Never true  Transportation Needs: No Transportation Needs (06/07/2022)   PRAPARE - Hydrologist (Medical): No    Lack of Transportation (Non-Medical): No  Physical Activity: Insufficiently Active (06/07/2022)   Exercise Vital Sign    Days of Exercise per Week: 3 days    Minutes of Exercise per Session: 30 min  Stress: No Stress Concern Present (06/07/2022)   [place]    Feeling of Stress : Not at all  Social Connections: Unknown (06/07/2022)   Social Connection and Isolation Panel [NHANES]    Frequency of Communication with Friends and Family: More than three times a week    Frequency of Social Gatherings with Friends and Family: Three times a week    Attends Religious Services: Patient refused    Active Member of Clubs or Organizations: Yes    Attends Archivist Meetings: Patient refused    Marital Status: Divorced    Family History  Problem  Relation Age of Onset   Diabetes Maternal Grandmother    Heart disease Maternal Grandfather    Hypertension Father    Prostate cancer Father    Diabetes Mother    Multiple sclerosis Mother     Medications:       Current Outpatient Medications:    amLODipine (NORVASC) 5 MG tablet, Take 1 tablet (5 mg total) by mouth daily., Disp: 90 tablet, Rfl: 1   buPROPion (WELLBUTRIN XL) 150 MG 24 hr tablet, TAKE 1 TABLET BY MOUTH EVERY DAY IN THE MORNING, Disp: 90 tablet, Rfl: 0   diclofenac (VOLTAREN) 50 MG EC tablet, TAKE 1 TABLET BY MOUTH TWICE A DAY, Disp: 60 tablet, Rfl: 0   fluconazole (DIFLUCAN) 100 MG tablet, Take 1 tablet (100 mg total) by mouth daily., Disp: 30 tablet, Rfl: 0   lisinopril (ZESTRIL) 20 MG tablet, Take 1 tablet (20 mg total) by mouth daily., Disp: 90 tablet, Rfl: 1   metformin (FORTAMET) 500 MG (OSM) 24 hr tablet, Take 1 tablet (500 mg total) by mouth daily with breakfast., Disp: 90 tablet, Rfl: 3   NUVESSA 1.3 % GEL, Place 1 applicator vaginally once for 1 dose., Disp: 5 g, Rfl: 1   Semaglutide,0.25 or 0.5MG /DOS, 2 MG/3ML SOPN, Inject 0.25 mg into the skin once a week., Disp: 3 mL, Rfl: 3   simvastatin (ZOCOR) 40 MG tablet, Take 40 mg by mouth daily., Disp: , Rfl:    Vitamin D, Ergocalciferol, (DRISDOL) 1.25 MG (50000 UNIT) CAPS capsule, Take 50,000 Units by mouth once a week., Disp: , Rfl:   Objective Blood pressure 135/83, pulse 84, height 5\' 9"  (1.753 m), weight 187 lb (84.8 kg).  General WDWN female NAD Vulva:  normal appearing vulva with no masses, tenderness or lesions Vagina:  normal mucosa, slight discharge  Wet Prep:   A sample of vaginal discharge was obtained from the posterior fornix using a cotton swab. 2 drops of saline were placed on a slide and the cotton swab was immersed in the saline. Microscopic evaluation was performed and results were as follows:  Negative  for yeast  Positive for clue cells , consistent with Bacterial vaginosis Negative for  trichomonas  Normal WBC population   Whiff test: Positive  Cervix:  Normal no lesions Uterus:  normal size, contour, position, consistency, mobility, non-tender Adnexa: ovaries:present,  normal adnexa in size, nontender and no masses   Pertinent ROS No burning with  urination, frequency or urgency No nausea, vomiting or diarrhea Nor fever chills or other constitutional symptoms   Labs or studies Wet prep+ BV    Impression + Management Plan: Diagnoses this Encounter::   ICD-10-CM   1. BV (bacterial vaginosis)  N76.0    B96.89    nuvessa Rx    2. Candidal vulvitis--Resolved on diflucan + starting ozempic, A1C was 13.6!!  B37.31         Medications prescribed during  this encounter: Meds ordered this encounter  Medications   NUVESSA 1.3 % GEL    Sig: Place 1 applicator vaginally once for 1 dose.    Dispense:  5 g    Refill:  1    Labs or Scans Ordered during this encounter: No orders of the defined types were placed in this encounter.     Follow up Return if symptoms worsen or fail to improve.

## 2022-07-31 ENCOUNTER — Other Ambulatory Visit: Payer: Self-pay | Admitting: Family Medicine

## 2022-07-31 DIAGNOSIS — E1159 Type 2 diabetes mellitus with other circulatory complications: Secondary | ICD-10-CM

## 2022-08-24 ENCOUNTER — Telehealth: Payer: Self-pay

## 2022-08-24 MED ORDER — FLUCONAZOLE 150 MG PO TABS: ORAL_TABLET | ORAL | 1 refills | Status: DC

## 2022-08-24 NOTE — Telephone Encounter (Signed)
Patient called and stated that she needs some more Diflucan called in.

## 2022-08-24 NOTE — Telephone Encounter (Signed)
Pt called with concerns of a yeast infection starting again. She had a very bad one back in January that had to be treated with 30 days of diflucan. She isn't feeling like its to that level but is very itchy. Pt aware Dr. Elonda Husky is out of the office until 4/2. I discussed patient with Derrek Monaco and she will send in rx for diflucan. Also made an appointment for her to be seen Tuesday afternoon if needed. No other questions at this time.

## 2022-08-24 NOTE — Telephone Encounter (Signed)
Rx sent in for diflucan and will see next week

## 2022-08-28 ENCOUNTER — Ambulatory Visit (INDEPENDENT_AMBULATORY_CARE_PROVIDER_SITE_OTHER): Payer: BC Managed Care – PPO | Admitting: Adult Health

## 2022-08-28 ENCOUNTER — Encounter: Payer: Self-pay | Admitting: Adult Health

## 2022-08-28 ENCOUNTER — Other Ambulatory Visit (HOSPITAL_COMMUNITY)
Admission: RE | Admit: 2022-08-28 | Discharge: 2022-08-28 | Disposition: A | Discharge status: Home / Self Care | Payer: BC Managed Care – PPO | Source: Ambulatory Visit | Attending: Adult Health | Admitting: Adult Health

## 2022-08-28 VITALS — BP 122/78 | HR 107 | Ht 69.0 in | Wt 186.5 lb

## 2022-08-28 DIAGNOSIS — B3731 Acute candidiasis of vulva and vagina: Secondary | ICD-10-CM | POA: Diagnosis not present

## 2022-08-28 DIAGNOSIS — N898 Other specified noninflammatory disorders of vagina: Secondary | ICD-10-CM | POA: Insufficient documentation

## 2022-08-28 MED ORDER — FLUCONAZOLE 100 MG PO TABS: 100.0000 mg | ORAL_TABLET | Freq: Every day | ORAL | 0 refills | Status: DC

## 2022-08-28 NOTE — Progress Notes (Signed)
  Subjective:     Patient ID: Kiara Kent, female   DOB: 1960-11-28, 62 y.o.   MRN: [id number]  HPI [name] is a 63 year old black female,single, PM in complaining of itching in vaginal area. Has had yeast and BV in the past.  Last pap was ASCUS negative HPV 06/07/22  PCP is Darla Lesches NP  Review of Systems +itching in vaginal area Not having sex Reviewed past medical,surgical, social and family history. Reviewed medications and allergies.     Objective:   Physical Exam BP 122/78 (BP Location: Left Arm, Patient Position: Sitting, Cuff Size: Normal)   Pulse (!) 107   Ht 5\' 9"  (1.753 m)   Wt 186 lb 8 oz (84.6 kg)   BMI 27.54 kg/m     Skin warm and dry.Pelvic: external genitalia is normal in appearance,but vulva has white skin changes, vagina: white discharge without odor,urethra has no lesions or masses noted, cervix:smooth and bulbous, uterus: normal size, shape and contour, non tender, no masses felt, adnexa: no masses or tenderness noted. Bladder is non tender and no masses felt.CV swab obtained. Painted vulva and vaginal with gentian violet. Fall risk is moderate  [place] - 08/28/22 1436       Pregnancy Intention Screening   Does the patient want to become pregnant in the next year? No    Does the patient's partner want to become pregnant in the next year? No    Would the patient like to discuss contraceptive options today? No      Contraception Wrap Up   Current Method Female Sterilization    End Method Female Sterilization    Contraception Counseling Provided No            Examination chaperoned by Levy Pupa LPN  Assessment:     1. Itching in the vaginal area Has whiten of skin on vulva Painted vulva and vagina with gentian violet Will rx diflucan CV swab sent for BV and yeast   2. Candidal vulvitis Has itching and skin changes Will rx diflucan and painted vulva and vagina with genitan violet  Meds ordered this encounter  Medications   fluconazole  (DIFLUCAN) 100 MG tablet    Sig: Take 1 tablet (100 mg total) by mouth daily. Do not take zocor while taking    Dispense:  14 tablet    Refill:  0    Order Specific Question:   Supervising Provider    Answer:   Florian Buff [[id number]]       Plan:     Follow up in 3 weeks for recheck

## 2022-08-30 ENCOUNTER — Other Ambulatory Visit: Payer: Self-pay | Admitting: Adult Health

## 2022-08-30 LAB — CERVICOVAGINAL ANCILLARY ONLY
Bacterial Vaginitis (gardnerella): POSITIVE — AB
Candida Glabrata: NEGATIVE
Candida Vaginitis: NEGATIVE
Comment: NEGATIVE
Comment: NEGATIVE
Comment: NEGATIVE

## 2022-08-30 MED ORDER — METRONIDAZOLE 500 MG PO TABS: 500.0000 mg | ORAL_TABLET | Freq: Two times a day (BID) | ORAL | 0 refills | Status: DC

## 2022-09-17 NOTE — Patient Instructions (Signed)
Our records indicate that you are due for your annual mammogram/breast imaging. While there is no way to prevent breast cancer, early detection provides the best opportunity for curing it. For women over the age of 40, the American Cancer Society recommends a yearly clinical breast exam and a yearly mammogram. These practices have saved thousands of lives. We need your help to ensure that you are receiving optimal medical care. Please call the imaging location that has done you previous mammograms. Please remember to list us as your primary care. This helps make sure we receive a report and can update your chart.  Below is the contact information for several local breast imaging centers. You may call the location that works best for you, and they will be happy to assistance in making you an appointment. You do not need an order for a regular screening mammogram. However, if you are having any problems or concerns with you breast area, please let your primary care provider know, and appropriate orders will be placed. Please let our office know if you have any questions or concerns. Or if you need information for another imaging center not on this list or outside of the area. We are commented to working with you on your health care journey.   The mobile unit/bus ([facility]) - they come twice a month to our location.  These appointments can be made through our office or by call The Breast Center  [facility]  [address] [place], [place] [address] Phone [phone]  [facility] Radiology Department  [address]  [place], [place] [address] [phone]  [facility])  [address] [place], [place] [address] [phone]  [facility] [place]  [address] [place] [address] [phone]  [facility]  [address], [address] [place] [address] [phone]  [facility] [place]  [address] [place], [place] [address] [phone]  [facility] [address] [place], [place] [address] [phone]  [facility] [address] [place], [place] [address] [phone]  [facility] [address] [place] [address] [phone]     

## 2022-09-18 ENCOUNTER — Ambulatory Visit: Payer: BC Managed Care – PPO | Admitting: Adult Health

## 2022-09-19 ENCOUNTER — Encounter: Payer: Self-pay | Admitting: Family Medicine

## 2022-09-19 ENCOUNTER — Ambulatory Visit (INDEPENDENT_AMBULATORY_CARE_PROVIDER_SITE_OTHER): Payer: BC Managed Care – PPO | Admitting: Family Medicine

## 2022-09-19 VITALS — BP 130/84 | HR 91 | Temp 98.1°F | Ht 69.0 in | Wt 186.2 lb

## 2022-09-19 DIAGNOSIS — E785 Hyperlipidemia, unspecified: Secondary | ICD-10-CM | POA: Diagnosis not present

## 2022-09-19 DIAGNOSIS — Z91199 Patient's noncompliance with other medical treatment and regimen due to unspecified reason: Secondary | ICD-10-CM

## 2022-09-19 DIAGNOSIS — F411 Generalized anxiety disorder: Secondary | ICD-10-CM | POA: Diagnosis not present

## 2022-09-19 DIAGNOSIS — E559 Vitamin D deficiency, unspecified: Secondary | ICD-10-CM | POA: Diagnosis not present

## 2022-09-19 DIAGNOSIS — E1169 Type 2 diabetes mellitus with other specified complication: Secondary | ICD-10-CM | POA: Diagnosis not present

## 2022-09-19 DIAGNOSIS — E1159 Type 2 diabetes mellitus with other circulatory complications: Secondary | ICD-10-CM | POA: Diagnosis not present

## 2022-09-19 DIAGNOSIS — I152 Hypertension secondary to endocrine disorders: Secondary | ICD-10-CM | POA: Diagnosis not present

## 2022-09-19 DIAGNOSIS — Z7985 Long-term (current) use of injectable non-insulin antidiabetic drugs: Secondary | ICD-10-CM

## 2022-09-19 LAB — BAYER DCA HB A1C WAIVED: HB A1C (BAYER DCA - WAIVED): 12.6 % — ABNORMAL HIGH (ref 4.8–5.6)

## 2022-09-19 MED ORDER — OZEMPIC (0.25 OR 0.5 MG/DOSE) 2 MG/3ML ~~LOC~~ SOPN
0.5000 mg | PEN_INJECTOR | SUBCUTANEOUS | 3 refills | Status: DC
Start: 2022-09-19 — End: 2022-09-19

## 2022-09-19 MED ORDER — LISINOPRIL 20 MG PO TABS: 20.0000 mg | ORAL_TABLET | Freq: Every day | ORAL | 1 refills | Status: DC

## 2022-09-19 MED ORDER — BUPROPION HCL ER (XL) 150 MG PO TB24
ORAL_TABLET | ORAL | 1 refills | Status: DC
Start: 2022-09-19 — End: 2023-11-05

## 2022-09-19 MED ORDER — OZEMPIC (0.25 OR 0.5 MG/DOSE) 2 MG/3ML ~~LOC~~ SOPN
0.5000 mg | PEN_INJECTOR | SUBCUTANEOUS | 0 refills | Status: AC
Start: 2022-09-19 — End: 2022-09-21

## 2022-09-19 MED ORDER — SEMAGLUTIDE (1 MG/DOSE) 4 MG/3ML ~~LOC~~ SOPN
1.0000 mg | PEN_INJECTOR | SUBCUTANEOUS | 3 refills | Status: DC
Start: 2022-09-19 — End: 2023-04-29

## 2022-09-19 NOTE — Progress Notes (Addendum)
 Subjective:  Patient ID: Kiara Kent, female    DOB: 05/15/1961, 62 y.o.   MRN: [id number]  Patient Care Team: Galvin Jules, FNP as PCP - General (Family Medicine) Alexia Idler, OD (Optometry)   Chief Complaint:  Annual Exam   HPI: Kiara Kent is a 62 y.o. female presenting on 09/19/2022 for Annual Exam   Pt presents today for management of chronic medical conditions. She has not been in office since 03/2022 and has a documented history of noncompliance with treatment plan. She has not been taking her metformin  or following a healthy diet. She has been taking the Ozempic  and denies associated side effects. She has not been checking her blood sugars. Denies polyuria, polyphagia, or polydipsia. She is taking her statin and denies associated side effects. She states she has been out of her Vit D and has not been taking over the counter repletion. She denies arthralgias, trouble walking, or recent fractures. She is compliant with her hypertension medications and denies chest pain, headaches, weakness, confusion, leg swelling, or visual changes. She has also been taking her anxiety medications and reports great control of symptoms. No associated side effects.       09/19/2022    3:32 PM 06/07/2022   10:22 AM 03/20/2022    3:16 PM 08/02/2021    9:25 AM  GAD 7 : Generalized Anxiety Score  Nervous, Anxious, on Edge 0 0 0 0  Control/stop worrying 0 0 0 0  Worry too much - different things 1 0 0 0  Trouble relaxing 0 1 0 0  Restless 0 0 0 0  Easily annoyed or irritable 0 0 0 2  Afraid - awful might happen 1 0 0 1  Total GAD 7 Score 2 1 0 3  Anxiety Difficulty Not difficult at all  Not difficult at all Somewhat difficult       09/19/2022    3:32 PM 06/07/2022   10:21 AM 03/20/2022    3:16 PM 08/02/2021    9:24 AM  Depression screen PHQ 2/9  Decreased Interest 0 0 0 0  Down, Depressed, Hopeless 0 0 0 0  PHQ - 2 Score 0 0 0 0  Altered sleeping 0 0 0 3  Tired, decreased energy 0 1 0  1  Change in appetite 0 1 0 0  Feeling bad or failure about yourself  1 1 0 0  Trouble concentrating 0 0 0 0  Moving slowly or fidgety/restless 0 0 0 0  Suicidal thoughts 0 0 0 0  PHQ-9 Score 1 3 0 4  Difficult doing work/chores Not difficult at all  Not difficult at all Somewhat difficult        Relevant past medical, surgical, family, and social history reviewed and updated as indicated.  Allergies and medications reviewed and updated. Data reviewed: Chart in Epic.   Past Medical History:  Diagnosis Date   Anxiety    Diabetes mellitus without complication    Hypercholesterolemia    Hypertension     Past Surgical History:  Procedure Laterality Date   CHOLECYSTECTOMY N/A 08/21/2019   Procedure: LAPAROSCOPIC CHOLECYSTECTOMY;  Surgeon: Awilda Bogus, MD;  Location: [facility];  Service: General;  Laterality: N/A;   TUBAL LIGATION      Social History   Socioeconomic History   Marital status: Single    Spouse name: Not on file   Number of children: Not on file   Years of education: Not on file   Highest  education level: Not on file  Occupational History   Not on file  Tobacco Use   Smoking status: Never   Smokeless tobacco: Never  Vaping Use   Vaping Use: Never used  Substance and Sexual Activity   Alcohol use: Yes    Comment: monthly   Drug use: Never   Sexual activity: Not Currently    Birth control/protection: Surgical    Comment: tubal  Other Topics Concern   Not on file  Social History Narrative   Not on file   Social Determinants of Health   Financial Resource Strain: Low Risk  (06/07/2022)   Overall Financial Resource Strain (CARDIA)    Difficulty of Paying Living Expenses: Not hard at all  Food Insecurity: No Food Insecurity (06/07/2022)   Hunger Vital Sign    Worried About Running Out of Food in the Last Year: Never true    Ran Out of Food in the Last Year: Never true  Transportation Needs: No Transportation Needs (06/07/2022)   PRAPARE -  Administrator, Civil Service (Medical): No    Lack of Transportation (Non-Medical): No  Physical Activity: Insufficiently Active (06/07/2022)   Exercise Vital Sign    Days of Exercise per Week: 3 days    Minutes of Exercise per Session: 30 min  Stress: No Stress Concern Present (06/07/2022)   [employer] of Occupational Health - Occupational Stress Questionnaire    Feeling of Stress : Not at all  Social Connections: Unknown (06/07/2022)   Social Connection and Isolation Panel [NHANES]    Frequency of Communication with Friends and Family: More than three times a week    Frequency of Social Gatherings with Friends and Family: Three times a week    Attends Religious Services: Patient declined    Active Member of Clubs or Organizations: Yes    Attends Banker Meetings: Patient declined    Marital Status: Divorced  Intimate Partner Violence: Not At Risk (06/07/2022)   Humiliation, Afraid, Rape, and Kick questionnaire    Fear of Current or Ex-Partner: No    Emotionally Abused: No    Physically Abused: No    Sexually Abused: No    Outpatient Encounter Medications as of 09/19/2022  Medication Sig   amLODipine  (NORVASC ) 5 MG tablet TAKE 1 TABLET (5 MG TOTAL) BY MOUTH DAILY.   diclofenac  (VOLTAREN ) 50 MG EC tablet TAKE 1 TABLET BY MOUTH TWICE A DAY   Semaglutide , 1 MG/DOSE, 4 MG/3ML SOPN Inject 1 mg as directed once a week.   simvastatin (ZOCOR) 40 MG tablet Take 40 mg by mouth daily.   [DISCONTINUED] buPROPion  (WELLBUTRIN  XL) 150 MG 24 hr tablet TAKE 1 TABLET BY MOUTH EVERY DAY IN THE MORNING   [DISCONTINUED] lisinopril  (ZESTRIL ) 20 MG tablet Take 1 tablet (20 mg total) by mouth daily.   [DISCONTINUED] metformin  (FORTAMET ) 500 MG (OSM) 24 hr tablet Take 1 tablet (500 mg total) by mouth daily with breakfast.   [DISCONTINUED] Semaglutide ,0.25 or 0.5MG /DOS, (OZEMPIC , 0.25 OR 0.5 MG/DOSE,) 2 MG/3ML SOPN Inject 0.5 mg into the skin once a week.   [DISCONTINUED]  Semaglutide ,0.25 or 0.5MG /DOS, 2 MG/3ML SOPN Inject 0.25 mg into the skin once a week.   buPROPion  (WELLBUTRIN  XL) 150 MG 24 hr tablet TAKE 1 TABLET BY MOUTH EVERY DAY IN THE MORNING   lisinopril  (ZESTRIL ) 20 MG tablet Take 1 tablet (20 mg total) by mouth daily.   Semaglutide ,0.25 or 0.5MG /DOS, (OZEMPIC , 0.25 OR 0.5 MG/DOSE,) 2 MG/3ML SOPN Inject 0.5 mg  into the skin once a week for 2 days.   Vitamin D , Ergocalciferol , (DRISDOL) 1.25 MG (50000 UNIT) CAPS capsule Take 50,000 Units by mouth once a week. (Patient not taking: Reported on 09/19/2022)   [DISCONTINUED] fluconazole  (DIFLUCAN ) 100 MG tablet Take 1 tablet (100 mg total) by mouth daily. Do not take zocor while taking   [DISCONTINUED] metroNIDAZOLE  (FLAGYL ) 500 MG tablet Take 1 tablet (500 mg total) by mouth 2 (two) times daily.   No facility-administered encounter medications on file as of 09/19/2022.    No Known Allergies  Review of Systems  Constitutional:  Negative for activity change, appetite change, chills, diaphoresis, fatigue, fever and unexpected weight change.  HENT: Negative.    Eyes: Negative.  Negative for photophobia and visual disturbance.  Respiratory:  Negative for cough, chest tightness and shortness of breath.   Cardiovascular:  Negative for chest pain, palpitations and leg swelling.  Gastrointestinal:  Negative for abdominal pain, blood in stool, constipation, diarrhea, nausea and vomiting.  Endocrine: Negative.  Negative for polydipsia, polyphagia and polyuria.  Genitourinary:  Negative for decreased urine volume, difficulty urinating, dysuria, frequency and urgency.  Musculoskeletal:  Negative for arthralgias and myalgias.  Skin: Negative.   Allergic/Immunologic: Negative.   Neurological:  Negative for dizziness, tremors, seizures, syncope, facial asymmetry, speech difficulty, weakness, light-headedness, numbness and headaches.  Hematological: Negative.   Psychiatric/Behavioral:  Negative for agitation, behavioral  problems, confusion, decreased concentration, dysphoric mood, hallucinations, self-injury, sleep disturbance and suicidal ideas. The patient is nervous/anxious. The patient is not hyperactive.   All other systems reviewed and are negative.       Objective:  BP 130/84   Pulse 91   Temp 98.1 F (36.7 C) (Temporal)   Ht 5\' 9"  (1.753 m)   Wt 186 lb 3.2 oz (84.5 kg)   SpO2 97%   BMI 27.50 kg/m    Wt Readings from Last 3 Encounters:  09/19/22 186 lb 3.2 oz (84.5 kg)  08/28/22 186 lb 8 oz (84.6 kg)  07/05/22 187 lb (84.8 kg)    Physical Exam Vitals and nursing note reviewed.  Constitutional:      General: She is not in acute distress.    Appearance: Normal appearance. She is well-developed and well-groomed. She is obese. She is not ill-appearing, toxic-appearing or diaphoretic.  HENT:     Head: Normocephalic and atraumatic.     Jaw: There is normal jaw occlusion.     Right Ear: Hearing normal.     Left Ear: Hearing normal.     Nose: Nose normal.     Mouth/Throat:     Lips: Pink.     Mouth: Mucous membranes are moist.     Pharynx: Oropharynx is clear. Uvula midline.  Eyes:     General: Lids are normal.     Extraocular Movements: Extraocular movements intact.     Conjunctiva/sclera: Conjunctivae normal.     Pupils: Pupils are equal, round, and reactive to light.  Neck:     Thyroid: No thyroid mass, thyromegaly or thyroid tenderness.     Vascular: No carotid bruit or JVD.     Trachea: Trachea and phonation normal.  Cardiovascular:     Rate and Rhythm: Normal rate and regular rhythm.     Chest Wall: PMI is not displaced.     Pulses: Normal pulses.     Heart sounds: Normal heart sounds. No murmur heard.    No friction rub. No gallop.  Pulmonary:     Effort: Pulmonary effort is  normal. No respiratory distress.     Breath sounds: Normal breath sounds. No wheezing.  Abdominal:     General: Bowel sounds are normal. There is no distension or abdominal bruit.     Palpations:  Abdomen is soft. There is no hepatomegaly or splenomegaly.     Tenderness: There is no abdominal tenderness. There is no right CVA tenderness or left CVA tenderness.     Hernia: No hernia is present.  Musculoskeletal:        General: Normal range of motion.     Cervical back: Normal range of motion and neck supple.     Right lower leg: No edema.     Left lower leg: No edema.  Lymphadenopathy:     Cervical: No cervical adenopathy.  Skin:    General: Skin is warm and dry.     Capillary Refill: Capillary refill takes less than 2 seconds.     Coloration: Skin is not cyanotic, jaundiced or pale.     Findings: No rash.  Neurological:     General: No focal deficit present.     Mental Status: She is alert and oriented to person, place, and time.     Sensory: Sensation is intact.     Motor: Motor function is intact.     Coordination: Coordination is intact.     Gait: Gait is intact.     Deep Tendon Reflexes: Reflexes are normal and symmetric.  Psychiatric:        Attention and Perception: Attention and perception normal.        Mood and Affect: Mood and affect normal.        Speech: Speech normal.        Behavior: Behavior normal. Behavior is cooperative.        Thought Content: Thought content normal.        Cognition and Memory: Cognition and memory normal.        Judgment: Judgment normal.     Results for orders placed or performed in visit on 09/19/22  Bayer DCA Hb A1c Waived  Result Value Ref Range   HB A1C (BAYER DCA - WAIVED) 12.6 (H) 4.8 - 5.6 %       Pertinent labs & imaging results that were available during my care of the patient were reviewed by me and considered in my medical decision making.  Assessment & Plan:  Zamora was seen today for annual exam.  Diagnoses and all orders for this visit:  Type 2 diabetes mellitus with other specified complication, without long-term current use of insulin Has not been taking metformin  as this causes significant GI upset. Will  escalate dosing of Ozempic  and repeat A1C in 3 months. Pt does not wish to add an additional medication to her regimen at this time. She agrees to dietary and lifestyle changes to help lower the numbers.  -     Bayer DCA Hb A1c Waived -     Microalbumin / creatinine urine ratio -     Discontinue: Semaglutide ,0.25 or 0.5MG /DOS, (OZEMPIC , 0.25 OR 0.5 MG/DOSE,) 2 MG/3ML SOPN; Inject 0.5 mg into the skin once a week. -     Semaglutide , 1 MG/DOSE, 4 MG/3ML SOPN; Inject 1 mg as directed once a week. -     Semaglutide ,0.25 or 0.5MG /DOS, (OZEMPIC , 0.25 OR 0.5 MG/DOSE,) 2 MG/3ML SOPN; Inject 0.5 mg into the skin once a week for 2 days.  Hyperlipidemia associated with type 2 diabetes mellitus Diet encouraged - increase intake of fresh  fruits and vegetables, increase intake of lean proteins. Bake, broil, or grill foods. Avoid fried, greasy, and fatty foods. Avoid fast foods. Increase intake of fiber-rich whole grains. Exercise encouraged - at least 150 minutes per week and advance as tolerated.  Goal BMI < 25. Continue medications as prescribed. Follow up in 3-6 months as discussed.  -     Lipid panel  Hypertension associated with diabetes BP well controlled. Changes were not made in regimen today. Goal BP is 130/80. Pt aware to report any persistent high or low readings. DASH diet and exercise encouraged. Exercise at least 150 minutes per week and increase as tolerated. Goal BMI > 25. Stress management encouraged. Avoid nicotine and tobacco product use. Avoid excessive alcohol and NSAID's. Avoid more than 2000 mg of sodium daily. Medications as prescribed. Follow up as scheduled.  -     CMP14+EGFR -     Microalbumin / creatinine urine ratio -     lisinopril  (ZESTRIL ) 20 MG tablet; Take 1 tablet (20 mg total) by mouth daily.  Vitamin D  deficiency Labs pending. Continue repletion therapy. If indicated, will change repletion dosage. Eat foods rich in Vit D including milk, orange juice, yogurt with vitamin D   added, salmon or mackerel, canned tuna fish, cereals with vitamin D  added, and cod liver oil. Get out in the sun but make sure to wear at least SPF 30 sunscreen.  -     VITAMIN D  25 Hydroxy (Vit-D Deficiency, Fractures)  GAD (generalized anxiety disorder) Doing well on below, will continue.  -     buPROPion  (WELLBUTRIN  XL) 150 MG 24 hr tablet; TAKE 1 TABLET BY MOUTH EVERY DAY IN THE MORNING     Continue all other maintenance medications.  Follow up plan: Return in about 3 months (around 12/19/2022) for CPE.   Continue healthy lifestyle choices, including diet (rich in fruits, vegetables, and lean proteins, and low in salt and simple carbohydrates) and exercise (at least 30 minutes of moderate physical activity daily).  Educational handout given for mammogram  The above assessment and management plan was discussed with the patient. The patient verbalized understanding of and has agreed to the management plan. Patient is aware to call the clinic if they develop any new symptoms or if symptoms persist or worsen. Patient is aware when to return to the clinic for a follow-up visit. Patient educated on when it is appropriate to go to the emergency department.   Kattie Parrot, FNP-C [facility] [phone]

## 2022-09-20 LAB — CMP14+EGFR
ALT: 14 IU/L (ref 0–32)
AST: 9 IU/L (ref 0–40)
Albumin/Globulin Ratio: 1.2 (ref 1.2–2.2)
Albumin: 4.3 g/dL (ref 3.8–4.9)
Alkaline Phosphatase: 74 IU/L (ref 44–121)
BUN/Creatinine Ratio: 16 (ref 9–23)
BUN: 12 mg/dL (ref 6–24)
Bilirubin Total: <0.2 mg/dL (ref 0.0–1.2)
CO2: 26 mmol/L (ref 20–29)
Calcium: 9.5 mg/dL (ref 8.7–10.2)
Chloride: 99 mmol/L (ref 96–106)
Creatinine, Ser: 0.76 mg/dL (ref 0.57–1.00)
Globulin, Total: 3.6 g/dL (ref 1.5–4.5)
Glucose: 378 mg/dL — ABNORMAL HIGH (ref 70–99)
Potassium: 4.2 mmol/L (ref 3.5–5.2)
Sodium: 139 mmol/L (ref 134–144)
Total Protein: 7.9 g/dL (ref 6.0–8.5)
eGFR: 90 mL/min/{1.73_m2} (ref >59)

## 2022-09-20 LAB — LIPID PANEL
Chol/HDL Ratio: 5 ratio — ABNORMAL HIGH (ref 0.0–4.4)
Cholesterol, Total: 234 mg/dL — ABNORMAL HIGH (ref 100–199)
HDL: 47 mg/dL (ref >39)
LDL Chol Calc (NIH): 153 mg/dL — ABNORMAL HIGH (ref 0–99)
Triglycerides: 185 mg/dL — ABNORMAL HIGH (ref 0–149)
VLDL Cholesterol Cal: 34 mg/dL (ref 5–40)

## 2022-09-20 LAB — VITAMIN D 25 HYDROXY (VIT D DEFICIENCY, FRACTURES): Vit D, 25-Hydroxy: 20.7 ng/mL — ABNORMAL LOW (ref 30.0–100.0)

## 2022-09-29 DIAGNOSIS — H5213 Myopia, bilateral: Secondary | ICD-10-CM | POA: Diagnosis not present

## 2023-01-01 ENCOUNTER — Other Ambulatory Visit: Payer: Self-pay | Admitting: Family Medicine

## 2023-01-01 DIAGNOSIS — E1159 Type 2 diabetes mellitus with other circulatory complications: Secondary | ICD-10-CM

## 2023-01-08 ENCOUNTER — Encounter: Payer: BC Managed Care – PPO | Admitting: Family Medicine

## 2023-04-11 ENCOUNTER — Other Ambulatory Visit: Payer: Self-pay | Admitting: Family Medicine

## 2023-04-11 ENCOUNTER — Ambulatory Visit: Payer: BC Managed Care – PPO | Admitting: Family Medicine

## 2023-04-11 DIAGNOSIS — E1159 Type 2 diabetes mellitus with other circulatory complications: Secondary | ICD-10-CM

## 2023-04-11 DIAGNOSIS — E1169 Type 2 diabetes mellitus with other specified complication: Secondary | ICD-10-CM

## 2023-04-28 ENCOUNTER — Other Ambulatory Visit: Payer: Self-pay | Admitting: Family Medicine

## 2023-04-28 DIAGNOSIS — E1169 Type 2 diabetes mellitus with other specified complication: Secondary | ICD-10-CM

## 2023-04-30 DIAGNOSIS — B351 Tinea unguium: Secondary | ICD-10-CM | POA: Diagnosis not present

## 2023-04-30 DIAGNOSIS — E1142 Type 2 diabetes mellitus with diabetic polyneuropathy: Secondary | ICD-10-CM | POA: Diagnosis not present

## 2023-04-30 DIAGNOSIS — L84 Corns and callosities: Secondary | ICD-10-CM | POA: Diagnosis not present

## 2023-04-30 DIAGNOSIS — M79676 Pain in unspecified toe(s): Secondary | ICD-10-CM | POA: Diagnosis not present

## 2023-05-20 ENCOUNTER — Telehealth: Payer: Self-pay

## 2023-05-20 NOTE — Patient Outreach (Signed)
Successful call to patient on today regarding preventative mammogram screening. Patient declined at this time and will follow up with PCP at later date. Date for upcoming mobile bus shared with patient.  Baruch Gouty [place]/VBCI  [facility] Assistant-Population Health [phone]

## 2023-06-01 ENCOUNTER — Other Ambulatory Visit: Payer: Self-pay | Admitting: Family Medicine

## 2023-06-01 DIAGNOSIS — I152 Hypertension secondary to endocrine disorders: Secondary | ICD-10-CM

## 2023-07-18 ENCOUNTER — Other Ambulatory Visit: Payer: Self-pay | Admitting: Pharmacist

## 2023-07-18 NOTE — Progress Notes (Signed)
   Patient was identified as falling into the [place] Measure - Diabetes.   Patient was: Appointment scheduled with primary care provider in the next 30 days.  Referred to pharmacy for chronic disease management.   Unsuccessful outreach to patient x2.  Voicemail box has not been set up  F/u PCP appt on 2/19.25

## 2023-07-23 ENCOUNTER — Other Ambulatory Visit: Payer: Self-pay | Admitting: Family Medicine

## 2023-07-23 DIAGNOSIS — E1169 Type 2 diabetes mellitus with other specified complication: Secondary | ICD-10-CM

## 2023-07-24 ENCOUNTER — Encounter: Payer: BC Managed Care – PPO | Admitting: Family Medicine

## 2023-08-09 ENCOUNTER — Telehealth: Payer: Self-pay | Admitting: Pharmacist

## 2023-08-09 NOTE — Telephone Encounter (Signed)
 Patient was identified as falling into the True [place] Measure - Diabetes.   Patient was: Left voicemail to schedule with primary care provider.  Referred to pharmacy for chronic disease management.   Appt scheduled with PharmD  Kieth Brightly, PharmD, BCACP, CPP Clinical Pharmacist, [facility]

## 2023-08-20 DIAGNOSIS — G609 Hereditary and idiopathic neuropathy, unspecified: Secondary | ICD-10-CM | POA: Diagnosis not present

## 2023-08-20 DIAGNOSIS — B351 Tinea unguium: Secondary | ICD-10-CM | POA: Diagnosis not present

## 2023-08-20 DIAGNOSIS — E1142 Type 2 diabetes mellitus with diabetic polyneuropathy: Secondary | ICD-10-CM | POA: Diagnosis not present

## 2023-09-17 ENCOUNTER — Other Ambulatory Visit (INDEPENDENT_AMBULATORY_CARE_PROVIDER_SITE_OTHER): Admitting: Pharmacist

## 2023-09-17 ENCOUNTER — Other Ambulatory Visit: Payer: Self-pay | Admitting: Family Medicine

## 2023-09-17 DIAGNOSIS — E1159 Type 2 diabetes mellitus with other circulatory complications: Secondary | ICD-10-CM

## 2023-09-17 DIAGNOSIS — E785 Hyperlipidemia, unspecified: Secondary | ICD-10-CM

## 2023-09-17 DIAGNOSIS — I152 Hypertension secondary to endocrine disorders: Secondary | ICD-10-CM

## 2023-09-17 DIAGNOSIS — E1169 Type 2 diabetes mellitus with other specified complication: Secondary | ICD-10-CM

## 2023-09-17 DIAGNOSIS — Z7985 Long-term (current) use of injectable non-insulin antidiabetic drugs: Secondary | ICD-10-CM

## 2023-09-17 MED ORDER — AMLODIPINE BESYLATE 5 MG PO TABS: 5.0000 mg | ORAL_TABLET | Freq: Every day | ORAL | 1 refills | Status: DC

## 2023-09-17 MED ORDER — ROSUVASTATIN CALCIUM 10 MG PO TABS: 10.0000 mg | ORAL_TABLET | Freq: Every day | ORAL | 3 refills | Status: DC

## 2023-09-17 NOTE — Progress Notes (Signed)
 09/17/2023 Name: Kiara Kent MRN: [id number] DOB: [birth year]  Chief Complaint  Patient presents with   Diabetes    Kiara Kent is a 63 y.o. year old female who presented for a telephone visit.   They were referred to the pharmacist by their PCP for assistance in managing diabetes.    Subjective:  Care Team: Primary Care Provider: Sonny Masters, FNP ; Next Scheduled Visit: 11/2023   Medication Access/Adherence  Current Pharmacy:  [phone] - [place],  - [address] [place] [address] Phone: [phone] Fax: [phone]   Patient reports affordability concerns with their medications: No  Patient reports access/transportation concerns to their pharmacy: No  Patient reports adherence concerns with their medications:  Yes     Diabetes:  Current medications: Ozempic 1mg  Medications tried in the past: metformin d/c due to GI  Current glucose readings: not checking at home  Current meal patterns:  Discussed meal planning options and Plate method for healthy eating Avoid sugary drinks and desserts Incorporate balanced protein, non starchy veggies, 1 serving of carbohydrate with each meal Increase water intake Increase physical activity as able  Current physical activity: encouraged as able  Current medication access support: BC[employer] commercial   Objective:  Lab Results  Component Value Date   HGBA1C 12.6 (H) 09/19/2022    Lab Results  Component Value Date   CREATININE 0.76 09/19/2022   BUN 12 09/19/2022   NA 139 09/19/2022   K 4.2 09/19/2022   CL 99 09/19/2022   CO2 26 09/19/2022    Lab Results  Component Value Date   CHOL 234 (H) 09/19/2022   HDL 47 09/19/2022   LDLCALC 153 (H) 09/19/2022   TRIG 185 (H) 09/19/2022   CHOLHDL 5.0 (H) 09/19/2022    Medications Reviewed Today     Reviewed by Danella Maiers, [facility] (Pharmacist) on 09/17/23 at 1339  Med List Status: <None>   Medication Order Taking?  Sig Documenting Provider Last Dose Status Informant  amLODipine (NORVASC) 5 MG tablet [id number]  TAKE 1 TABLET (5 MG TOTAL) BY MOUTH DAILY. Gabriel Earing, FNP  Active   buPROPion (WELLBUTRIN XL) 150 MG 24 hr tablet [id number]  TAKE 1 TABLET BY MOUTH EVERY DAY IN THE MORNING Sonny Masters, FNP  Active   diclofenac (VOLTAREN) 50 MG EC tablet [id number] No TAKE 1 TABLET BY MOUTH TWICE A DAY Oliver Barre, MD Taking Active   lisinopril (ZESTRIL) 20 MG tablet [id number]  TAKE 1 TABLET BY MOUTH EVERY DAY Rakes, Doralee Albino, FNP  Active   Semaglutide, 1 MG/DOSE, (OZEMPIC, 1 MG/DOSE,) 4 MG/3ML SOPN [id number]  INJECT 1 MG ONCE A WEEK AS DIRECTED Rakes, Doralee Albino, FNP  Active            Med Note ([name] D   Tue Sep 17, 2023  1:39 PM) Fridays  simvastatin (ZOCOR) 40 MG tablet [id number] No Take 40 mg by mouth daily. [provider] Taking Active Self  Vitamin D, Ergocalciferol, (DRISDOL) 1.25 MG (50000 UNIT) CAPS capsule [id number] No Take 50,000 Units by mouth once a week.  Patient not taking: Reported on 09/19/2022   [provider] Not Taking Active             Assessment/Plan:   Diabetes: - Currently uncontrolled, per patient report A1c was down to 6.5% in November 2024 at work health fair - Reviewed long term cardiovascular and renal outcomes of uncontrolled blood sugar - Reviewed goal  A1c, goal fasting, and goal 2 hour post prandial glucose - Recommend to continue Ozempic Purchase ReliOn glucometer at [facility] Return to see PharmD in 1 month to get labs/plan moving forward (labs ordered and discussed with PCP) - Patient denies personal or family history of multiple endocrine neoplasia type 2, medullary thyroid cancer; personal history of pancreatitis or gallbladder disease.   30 min of patient care was provided to the patient during this visit time. No charge visit  Follow Up Plan: PharmD 10/2023, PCP 11/2023  Marvell Slider, PharmD, BCACP, CPP Clinical  Pharmacist, [facility]

## 2023-10-15 ENCOUNTER — Ambulatory Visit

## 2023-10-18 ENCOUNTER — Ambulatory Visit (INDEPENDENT_AMBULATORY_CARE_PROVIDER_SITE_OTHER): Admitting: Pharmacist

## 2023-10-18 VITALS — BP 139/85 | HR 95

## 2023-10-18 DIAGNOSIS — K219 Gastro-esophageal reflux disease without esophagitis: Secondary | ICD-10-CM | POA: Diagnosis not present

## 2023-10-18 DIAGNOSIS — E1169 Type 2 diabetes mellitus with other specified complication: Secondary | ICD-10-CM

## 2023-10-18 LAB — BAYER DCA HB A1C WAIVED: HB A1C (BAYER DCA - WAIVED): 5.8 % — ABNORMAL HIGH (ref 4.8–5.6)

## 2023-10-18 MED ORDER — PANTOPRAZOLE SODIUM 20 MG PO TBEC: 20.0000 mg | DELAYED_RELEASE_TABLET | Freq: Every day | ORAL | 0 refills | Status: DC

## 2023-10-18 NOTE — Progress Notes (Signed)
 10/18/2023 Name: Kiara Kent MRN: [id number] DOB: [birth year]  Chief Complaint  Patient presents with   Diabetes    Collyns Burczyk is a 63 y.o. year old female who was referred for medication management by their primary care provider, Rakes, Georgeann Kindred, FNP. They presented for a face to face visit today.   They were referred to the pharmacist by a quality report for assistance in managing diabetes    Subjective:  Patient reports she has lost over 50lbs since starting Ozempic  and she is doing well.  Her work held a wellness event in which her A1c was down to 6.4%.  She does not check glucose at home currently.  Care Team: Primary Care Provider: Galvin Jules, FNP ; Next Scheduled Visit: 11/2023   Medication Access/Adherence  Current Pharmacy:  [facility] [phone] - [place], [place] - [address] [place] [address] Phone: [phone] Fax: [phone]   Patient reports affordability concerns with their medications: No  Patient reports access/transportation concerns to their pharmacy: No  Patient reports adherence concerns with their medications:  No    Diabetes:  Current medications: Ozempic  1mg  weekly Medications tried in the past: metformin   Current glucose readings: not checking Does not have meter at home  Current meal patterns:  Patient has worked on diet and exercise She reports diet has improved Discussed meal planning options and Plate method for healthy eating Avoid sugary drinks and desserts Incorporate balanced protein, non starchy veggies, 1 serving of carbohydrate with each meal Increase water intake Increase physical activity as able  Current medication access support: BCBS   Objective:  Lab Results  Component Value Date   HGBA1C 12.6 (H) 09/19/2022    Lab Results  Component Value Date   CREATININE 0.76 09/19/2022   BUN 12 09/19/2022   NA 139 09/19/2022   K 4.2 09/19/2022   CL 99 09/19/2022   CO2 26  09/19/2022    Lab Results  Component Value Date   CHOL 234 (H) 09/19/2022   HDL 47 09/19/2022   LDLCALC 153 (H) 09/19/2022   TRIG 185 (H) 09/19/2022   CHOLHDL 5.0 (H) 09/19/2022    Medications Reviewed Today     Reviewed by Delilah Fend, [facility] (Pharmacist) on 10/18/23 at 1411  Med List Status: <None>   Medication Order Taking? Sig Documenting Provider Last Dose Status Informant  amLODipine  (NORVASC ) 5 MG tablet [id number]  Take 1 tablet (5 mg total) by mouth daily. Galvin Jules, FNP  Active   buPROPion  (WELLBUTRIN  XL) 150 MG 24 hr tablet [id number]  TAKE 1 TABLET BY MOUTH EVERY DAY IN THE MORNING Galvin Jules, FNP  Active   diclofenac  (VOLTAREN ) 50 MG EC tablet [id number] No TAKE 1 TABLET BY MOUTH TWICE A DAY Tonita Frater, MD Taking Active   lisinopril  (ZESTRIL ) 20 MG tablet [id number]  TAKE 1 TABLET BY MOUTH EVERY DAY Rakes, Georgeann Kindred, FNP  Active   pantoprazole  (PROTONIX ) 20 MG tablet [id number] Yes Take 1 tablet (20 mg total) by mouth daily. Dettinger, Lucio Sabin, MD  Active   rosuvastatin  (CRESTOR ) 10 MG tablet [id number]  Take 1 tablet (10 mg total) by mouth daily. Galvin Jules, FNP  Active   Semaglutide , 1 MG/DOSE, (OZEMPIC , 1 MG/DOSE,) 4 MG/3ML SOPN [id number]  INJECT 1 MG ONCE A WEEK AS DIRECTED Rakes, Georgeann Kindred, FNP  Active            Med Note Alpheus Arvin, Ahmoni Edge D   Tue Sep 17, 2023  1:39 PM) Fridays  Vitamin D , Ergocalciferol , (DRISDOL) 1.25 MG (50000 UNIT) CAPS capsule [id number] No Take 50,000 Units by mouth once a week.  Patient not taking: Reported on 09/19/2022   [provider] Not Taking Active              Assessment/Plan:   Diabetes: - Currently controlled; A1c today was 5.8% much improved - Reviewed long term cardiovascular and renal outcomes of uncontrolled blood sugar - Reviewed goal A1c, goal fasting, and goal 2 hour post prandial glucose - Recommend to : continue current therapy with Ozempic  1mg  weekly  - Patient denies personal or family history of  multiple endocrine neoplasia type 2, medullary thyroid cancer; personal history of pancreatitis or gallbladder disease. - Recommend to check glucose as needed at home; Contour glucometer given  - Patient unable to provide urine today; will get at PCP visit along with lipid panel and BMP/GFR    Follow Up Plan: PCP 11/2023   Marvell Slider, PharmD, BCACP, CPP Clinical Pharmacist, [facility]

## 2023-10-19 DIAGNOSIS — S63602A Unspecified sprain of left thumb, initial encounter: Secondary | ICD-10-CM | POA: Diagnosis not present

## 2023-10-19 DIAGNOSIS — E663 Overweight: Secondary | ICD-10-CM | POA: Diagnosis not present

## 2023-10-19 DIAGNOSIS — Z6825 Body mass index (BMI) 25.0-25.9, adult: Secondary | ICD-10-CM | POA: Diagnosis not present

## 2023-10-19 DIAGNOSIS — I10 Essential (primary) hypertension: Secondary | ICD-10-CM | POA: Diagnosis not present

## 2023-10-19 LAB — LIPID PANEL
Chol/HDL Ratio: 3.7 ratio (ref 0.0–4.4)
Cholesterol, Total: 183 mg/dL (ref 100–199)
HDL: 49 mg/dL (ref >39)
LDL Chol Calc (NIH): 119 mg/dL — ABNORMAL HIGH (ref 0–99)
Triglycerides: 80 mg/dL (ref 0–149)
VLDL Cholesterol Cal: 15 mg/dL (ref 5–40)

## 2023-10-22 ENCOUNTER — Other Ambulatory Visit: Payer: Self-pay | Admitting: Family Medicine

## 2023-10-22 DIAGNOSIS — E1169 Type 2 diabetes mellitus with other specified complication: Secondary | ICD-10-CM

## 2023-10-30 ENCOUNTER — Ambulatory Visit: Payer: Self-pay | Admitting: Family Medicine

## 2023-11-06 ENCOUNTER — Ambulatory Visit: Payer: BC Managed Care – PPO | Admitting: Family Medicine

## 2023-11-06 VITALS — BP 128/76 | HR 87 | Temp 96.1°F | Ht 69.0 in | Wt 169.4 lb

## 2023-11-06 DIAGNOSIS — Z1231 Encounter for screening mammogram for malignant neoplasm of breast: Secondary | ICD-10-CM

## 2023-11-06 DIAGNOSIS — E1159 Type 2 diabetes mellitus with other circulatory complications: Secondary | ICD-10-CM | POA: Diagnosis not present

## 2023-11-06 DIAGNOSIS — K581 Irritable bowel syndrome with constipation: Secondary | ICD-10-CM

## 2023-11-06 DIAGNOSIS — Z1211 Encounter for screening for malignant neoplasm of colon: Secondary | ICD-10-CM

## 2023-11-06 DIAGNOSIS — E1169 Type 2 diabetes mellitus with other specified complication: Secondary | ICD-10-CM | POA: Diagnosis not present

## 2023-11-06 DIAGNOSIS — E559 Vitamin D deficiency, unspecified: Secondary | ICD-10-CM | POA: Diagnosis not present

## 2023-11-06 DIAGNOSIS — Z1159 Encounter for screening for other viral diseases: Secondary | ICD-10-CM

## 2023-11-06 DIAGNOSIS — E785 Hyperlipidemia, unspecified: Secondary | ICD-10-CM

## 2023-11-06 DIAGNOSIS — F411 Generalized anxiety disorder: Secondary | ICD-10-CM | POA: Diagnosis not present

## 2023-11-06 DIAGNOSIS — Z114 Encounter for screening for human immunodeficiency virus [HIV]: Secondary | ICD-10-CM | POA: Diagnosis not present

## 2023-11-06 DIAGNOSIS — I152 Hypertension secondary to endocrine disorders: Secondary | ICD-10-CM

## 2023-11-06 DIAGNOSIS — Z0000 Encounter for general adult medical examination without abnormal findings: Secondary | ICD-10-CM

## 2023-11-06 DIAGNOSIS — Z0001 Encounter for general adult medical examination with abnormal findings: Secondary | ICD-10-CM

## 2023-11-06 DIAGNOSIS — Z6831 Body mass index (BMI) 31.0-31.9, adult: Secondary | ICD-10-CM

## 2023-11-06 MED ORDER — LISINOPRIL 20 MG PO TABS: 20.0000 mg | ORAL_TABLET | Freq: Every day | ORAL | 1 refills | Status: DC

## 2023-11-06 MED ORDER — LINACLOTIDE 72 MCG PO CAPS: 72.0000 ug | ORAL_CAPSULE | Freq: Every day | ORAL | 6 refills | Status: DC

## 2023-11-06 MED ORDER — ROSUVASTATIN CALCIUM 10 MG PO TABS: 10.0000 mg | ORAL_TABLET | Freq: Every day | ORAL | 1 refills | Status: DC

## 2023-11-06 MED ORDER — BUPROPION HCL ER (XL) 150 MG PO TB24: ORAL_TABLET | ORAL | 1 refills | Status: DC

## 2023-11-06 NOTE — Progress Notes (Signed)
 Complete physical exam  Patient: Kiara Kent   DOB: [birth year]   63 y.o. Female  MRN: [id number]  Subjective:     Chief Complaint  Patient presents with   Annual Exam    Kiara Kent is a 63 y.o. female who presents today for a complete physical exam. She reports consuming a diabetic, 1800-2200 calorie diet. The patient has a physically strenuous job, but has no regular exercise apart from work.  She generally feels well. She reports sleeping well. She does have additional problems to discuss today.    Kiara Kent is a 63 year old female who presents for an annual physical exam.  Her A1c has improved significantly from 12.6% to 5.8% since her last check. Cholesterol levels have also improved, with total cholesterol decreasing from 234 mg/dL to 409 mg/dL and LDL from 811 mg/dL to 914 mg/dL. She is on Ozempic  for diabetes management and initially experienced nausea when increasing the dose but has since tolerated it well. No numbness or tingling in her feet. No chest pain, shortness of breath, vision changes, leg swelling, night sweats, or unexplained weight loss, aside from some weight loss attributed to Ozempic .  She continues to experience constipation related to her irritable bowel syndrome (IBS). Previously used Linzess, which was effective, but she is unsure of the dosing. Without medication, she has approximately three bowel movements per week.  She stopped taking Wellbutrin  several months ago but feels she needs to restart it due to increased anxiety and stress. She was previously on a dose of 150 mg, which she found effective.  Her blood pressure has been slightly elevated during recent visits, including today, with a reading of 148/87 mmHg. She is currently taking lisinopril  and Norvasc  (amlodipine ) for hypertension. No headaches, chest pain, or leg swelling.  She has been visiting a doctor for a toe issue, for which she was prescribed gabapentin. No current numbness or  tingling in her feet. No changes in vision or hearing, and she reports sleeping well.      Most recent fall risk assessment:    11/06/2023    3:30 PM  Fall Risk   Falls in the past year? 0  Number falls in past yr: 0  Injury with Fall? 0  Risk for fall due to : No Fall Risks  Follow up Falls evaluation completed     Most recent depression screenings:    11/06/2023    3:30 PM 09/19/2022    3:32 PM 06/07/2022   10:21 AM 03/20/2022    3:16 PM 08/02/2021    9:24 AM  Depression screen PHQ 2/9  Decreased Interest 0 0 0 0 0  Down, Depressed, Hopeless 0 0 0 0 0  PHQ - 2 Score 0 0 0 0 0  Altered sleeping 0 0 0 0 3  Tired, decreased energy 0 0 1 0 1  Change in appetite 0 0 1 0 0  Feeling bad or failure about yourself  0 1 1 0 0  Trouble concentrating 0 0 0 0 0  Moving slowly or fidgety/restless 0 0 0 0 0  Suicidal thoughts 0 0 0 0 0  PHQ-9 Score 0 1 3 0 4  Difficult doing work/chores Not difficult at all Not difficult at all  Not difficult at all Somewhat difficult      11/06/2023    3:29 PM 09/19/2022    3:32 PM 06/07/2022   10:22 AM 03/20/2022    3:16 PM  GAD 7 : Generalized Anxiety  Score  Nervous, Anxious, on Edge 1 0 0 0  Control/stop worrying 1 0 0 0  Worry too much - different things 0 1 0 0  Trouble relaxing 0 0 1 0  Restless 0 0 0 0  Easily annoyed or irritable 1 0 0 0  Afraid - awful might happen 0 1 0 0  Total GAD 7 Score 3 2 1  0  Anxiety Difficulty Not difficult at all Not difficult at all  Not difficult at all       Vision:Not within last year  and Dental: No current dental problems and Receives regular dental care  Patient Active Problem List   Diagnosis Date Noted   Type 2 diabetes mellitus with other specified complication (HCC) 08/02/2021   Hyperlipidemia associated with type 2 diabetes mellitus (HCC) 08/02/2021   GAD (generalized anxiety disorder) 08/02/2021   Vitamin D  deficiency 08/02/2021   BMI 31.0-31.9,adult 08/02/2021   Gastroesophageal reflux disease  without esophagitis 08/02/2021   Irritable bowel syndrome with constipation 08/02/2021   Hypertension associated with diabetes (HCC) 02/21/2015   Iron deficiency anemia 11/30/2013   Past Medical History:  Diagnosis Date   Anxiety    Diabetes mellitus without complication (HCC)    Hypercholesterolemia    Hypertension    Past Surgical History:  Procedure Laterality Date   CHOLECYSTECTOMY N/A 08/21/2019   Procedure: LAPAROSCOPIC CHOLECYSTECTOMY;  Surgeon: Awilda Bogus, MD;  Location: [facility];  Service: General;  Laterality: N/A;   TUBAL LIGATION     Social History   Tobacco Use   Smoking status: Never   Smokeless tobacco: Never  Vaping Use   Vaping status: Never Used  Substance Use Topics   Alcohol use: Yes    Comment: monthly   Drug use: Never   Social History   Socioeconomic History   Marital status: Single    Spouse name: Not on file   Number of children: Not on file   Years of education: Not on file   Highest education level: 12th grade  Occupational History   Not on file  Tobacco Use   Smoking status: Never   Smokeless tobacco: Never  Vaping Use   Vaping status: Never Used  Substance and Sexual Activity   Alcohol use: Yes    Comment: monthly   Drug use: Never   Sexual activity: Not Currently    Birth control/protection: Surgical    Comment: tubal  Other Topics Concern   Not on file  Social History Narrative   Not on file   Social Drivers of Health   Financial Resource Strain: Low Risk  (11/06/2023)   Overall Financial Resource Strain (CARDIA)    Difficulty of Paying Living Expenses: Not very hard  Food Insecurity: Food Insecurity Present (11/06/2023)   Hunger Vital Sign    Worried About Running Out of Food in the Last Year: Sometimes true    Ran Out of Food in the Last Year: Sometimes true  Transportation Needs: No Transportation Needs (11/06/2023)   PRAPARE - Administrator, Civil Service (Medical): No    Lack of Transportation  (Non-Medical): No  Physical Activity: Insufficiently Active (11/06/2023)   Exercise Vital Sign    Days of Exercise per Week: 3 days    Minutes of Exercise per Session: 20 min  Stress: Stress Concern Present (11/06/2023)   [employer] of Occupational Health - Occupational Stress Questionnaire    Feeling of Stress : To some extent  Social Connections: Moderately Integrated (11/06/2023)  Social Advertising account executive [NHANES]    Frequency of Communication with Friends and Family: More than three times a week    Frequency of Social Gatherings with Friends and Family: Twice a week    Attends Religious Services: 1 to 4 times per year    Active Member of [employer] or Organizations: Yes    Attends Banker Meetings: 1 to 4 times per year    Marital Status: Divorced  Intimate Partner Violence: Not At Risk (06/07/2022)   Humiliation, Afraid, Rape, and Kick questionnaire    Fear of Current or Ex-Partner: No    Emotionally Abused: No    Physically Abused: No    Sexually Abused: No   Family Status  Relation Name Status   PGF  Deceased   PGM  Deceased   MGM  Deceased   MGF  Deceased   Father  Deceased   Mother  Deceased   Daughter  Alive   Daughter  Alive   Son  Alive   Son  Alive   Son  Alive  No partnership data on file   Family History  Problem Relation Age of Onset   Diabetes Maternal Grandmother    Heart disease Maternal Grandfather    Hypertension Father    Prostate cancer Father    Diabetes Mother    Multiple sclerosis Mother    Allergies  Allergen Reactions   Metformin  And Related Nausea Only    GI intolerance      Patient Care Team: Albert Devaul, Georgeann Kindred, FNP as PCP - General (Family Medicine) Alexia Idler, OD (Optometry)   Outpatient Medications Prior to Visit  Medication Sig   amLODipine  (NORVASC ) 5 MG tablet Take 1 tablet (5 mg total) by mouth daily.   pantoprazole  (PROTONIX ) 20 MG tablet Take 1 tablet (20 mg total) by mouth daily.   Semaglutide ,  1 MG/DOSE, (OZEMPIC , 1 MG/DOSE,) 4 MG/3ML SOPN INJECT 1 MG ONCE A WEEK AS DIRECTED   Vitamin D , Ergocalciferol , (DRISDOL) 1.25 MG (50000 UNIT) CAPS capsule Take 50,000 Units by mouth once a week.   [DISCONTINUED] buPROPion  (WELLBUTRIN  XL) 150 MG 24 hr tablet TAKE 1 TABLET BY MOUTH EVERY DAY IN THE MORNING   [DISCONTINUED] diclofenac  (VOLTAREN ) 50 MG EC tablet TAKE 1 TABLET BY MOUTH TWICE A DAY   [DISCONTINUED] lisinopril  (ZESTRIL ) 20 MG tablet TAKE 1 TABLET BY MOUTH EVERY DAY   [DISCONTINUED] rosuvastatin  (CRESTOR ) 10 MG tablet Take 1 tablet (10 mg total) by mouth daily.   No facility-administered medications prior to visit.    ROS per HPI        Objective:     BP 128/76   Pulse 87   Temp (!) 96.1 F (35.6 C)   Ht 5\' 9"  (1.753 m)   Wt 169 lb 6.4 oz (76.8 kg)   SpO2 99%   BMI 25.02 kg/m  BP Readings from Last 3 Encounters:  11/06/23 128/76  10/18/23 139/85  09/19/22 130/84   Wt Readings from Last 3 Encounters:  11/06/23 169 lb 6.4 oz (76.8 kg)  09/19/22 186 lb 3.2 oz (84.5 kg)  08/28/22 186 lb 8 oz (84.6 kg)   SpO2 Readings from Last 3 Encounters:  11/06/23 99%  09/19/22 97%  03/20/22 97%      Physical Exam Vitals and nursing note reviewed.  Constitutional:      General: She is not in acute distress.    Appearance: Normal appearance. She is well-developed and well-groomed. She is not ill-appearing, toxic-appearing or  diaphoretic.  HENT:     Head: Normocephalic and atraumatic.     Jaw: There is normal jaw occlusion.     Right Ear: Hearing, tympanic membrane, ear canal and external ear normal.     Left Ear: Hearing, tympanic membrane, ear canal and external ear normal.     Nose: Nose normal.     Mouth/Throat:     Lips: Pink.     Mouth: Mucous membranes are moist.     Pharynx: Oropharynx is clear. Uvula midline.  Eyes:     General: Lids are normal.     Extraocular Movements: Extraocular movements intact.     Conjunctiva/sclera: Conjunctivae normal.      Pupils: Pupils are equal, round, and reactive to light.  Neck:     Thyroid: No thyroid mass, thyromegaly or thyroid tenderness.     Vascular: No carotid bruit or JVD.     Trachea: Trachea and phonation normal.  Cardiovascular:     Rate and Rhythm: Normal rate and regular rhythm.     Chest Wall: PMI is not displaced.     Pulses: Normal pulses.          Dorsalis pedis pulses are 2+ on the right side and 2+ on the left side.       Posterior tibial pulses are 2+ on the right side and 2+ on the left side.     Heart sounds: Normal heart sounds. No murmur heard.    No friction rub. No gallop.  Pulmonary:     Effort: Pulmonary effort is normal. No respiratory distress.     Breath sounds: Normal breath sounds. No wheezing.  Abdominal:     General: Bowel sounds are normal. There is no distension or abdominal bruit.     Palpations: Abdomen is soft. There is no hepatomegaly or splenomegaly.     Tenderness: There is no abdominal tenderness. There is no right CVA tenderness or left CVA tenderness.     Hernia: No hernia is present.  Genitourinary:    Comments: Deferred, PAP up to date Musculoskeletal:        General: Normal range of motion.     Cervical back: Normal range of motion and neck supple.     Right lower leg: No edema.     Left lower leg: No edema.     Right foot: Normal range of motion. No deformity, bunion, Charcot foot, foot drop or prominent metatarsal heads.     Left foot: Normal range of motion. No deformity, bunion, Charcot foot, foot drop or prominent metatarsal heads.  Feet:     Right foot:     Protective Sensation: 10 sites tested.  10 sites sensed.     Skin integrity: Skin integrity normal.     Toenail Condition: Right toenails are normal.     Left foot:     Protective Sensation: 10 sites tested.  10 sites sensed.     Skin integrity: Skin integrity normal.     Toenail Condition: Left toenails are normal.  Lymphadenopathy:     Cervical: No cervical adenopathy.  Skin:     General: Skin is warm and dry.     Capillary Refill: Capillary refill takes less than 2 seconds.     Coloration: Skin is not cyanotic, jaundiced or pale.     Findings: No rash.  Neurological:     General: No focal deficit present.     Mental Status: She is alert and oriented to person, place, and time.  Sensory: Sensation is intact.     Motor: Motor function is intact.     Coordination: Coordination is intact.     Gait: Gait is intact.     Deep Tendon Reflexes: Reflexes are normal and symmetric.  Psychiatric:        Attention and Perception: Attention and perception normal.        Mood and Affect: Mood and affect normal.        Speech: Speech normal.        Behavior: Behavior normal. Behavior is cooperative.        Thought Content: Thought content normal.        Cognition and Memory: Cognition and memory normal.        Judgment: Judgment normal.      Last CBC Lab Results  Component Value Date   WBC 4.4 03/20/2022   HGB 11.9 03/20/2022   HCT 36.2 03/20/2022   MCV 92 03/20/2022   MCH 30.2 03/20/2022   RDW 11.9 03/20/2022   PLT 294 03/20/2022   Last metabolic panel Lab Results  Component Value Date   GLUCOSE 378 (H) 09/19/2022   NA 139 09/19/2022   K 4.2 09/19/2022   CL 99 09/19/2022   CO2 26 09/19/2022   BUN 12 09/19/2022   CREATININE 0.76 09/19/2022   EGFR 90 09/19/2022   CALCIUM  9.5 09/19/2022   PROT 7.9 09/19/2022   ALBUMIN 4.3 09/19/2022   LABGLOB 3.6 09/19/2022   AGRATIO 1.2 09/19/2022   BILITOT <0.2 09/19/2022   ALKPHOS 74 09/19/2022   AST 9 09/19/2022   ALT 14 09/19/2022   ANIONGAP 10 08/20/2019   Last lipids Lab Results  Component Value Date   CHOL 183 10/18/2023   HDL 49 10/18/2023   LDLCALC 119 (H) 10/18/2023   TRIG 80 10/18/2023   CHOLHDL 3.7 10/18/2023   Last hemoglobin A1c Lab Results  Component Value Date   HGBA1C 5.8 (H) 10/18/2023   Last vitamin D  Lab Results  Component Value Date   VD25OH 20.7 (L) 09/19/2022      Assessment  & Plan:    Routine Health Maintenance and Physical Exam  Immunization History  Administered Date(s) Administered   Influenza,inj,Quad PF,6+ Mos 04/22/2019, 08/02/2021, 03/20/2022   Influenza,inj,quad, With Preservative 04/22/2019   PFIZER(Purple Top)SARS-COV-2 Vaccination 09/03/2019, 09/25/2019, 07/04/2020   Pneumococcal Polysaccharide-23 07/13/2020   Tdap 11/01/2011    Health Maintenance  Topic Date Due   HIV Screening  Never done   Hepatitis C Screening  Never done   Fecal DNA (Cologuard)  Never done   MAMMOGRAM  Never done   Diabetic kidney evaluation - Urine ACR  08/03/2022   OPHTHALMOLOGY EXAM  04/17/2023   Diabetic kidney evaluation - eGFR measurement  09/19/2023   COVID-19 Vaccine (4 - 2024-25 season) 11/22/2023 (Originally 02/03/2023)   Zoster Vaccines- Shingrix (1 of 2) 02/06/2024 (Originally 01/13/2013)   DTaP/Tdap/Td (2 - Td or Tdap) 11/05/2024 (Originally 10/31/2021)   Pneumococcal Vaccine 7-71 Years old (2 of 2 - PCV) 11/05/2024 (Originally 07/13/2021)   INFLUENZA VACCINE  01/03/2024   HEMOGLOBIN A1C  04/19/2024   FOOT EXAM  11/05/2024   Cervical Cancer Screening (HPV/Pap Cotest)  06/08/2027   HPV VACCINES  Aged Out   Meningococcal B Vaccine  Aged Out    Discussed health benefits of physical activity, and encouraged her to engage in regular exercise appropriate for her age and condition.  Problem List Items Addressed This Visit       Cardiovascular and Mediastinum  Hypertension associated with diabetes (HCC)   Relevant Medications   lisinopril  (ZESTRIL ) 20 MG tablet   rosuvastatin  (CRESTOR ) 10 MG tablet     Digestive   Irritable bowel syndrome with constipation   Relevant Medications   linaclotide (LINZESS) 72 MCG capsule     Endocrine   Type 2 diabetes mellitus with other specified complication (HCC)   Relevant Medications   lisinopril  (ZESTRIL ) 20 MG tablet   rosuvastatin  (CRESTOR ) 10 MG tablet   Other Relevant Orders   Microalbumin / creatinine  urine ratio   Hyperlipidemia associated with type 2 diabetes mellitus (HCC)   Relevant Medications   lisinopril  (ZESTRIL ) 20 MG tablet   rosuvastatin  (CRESTOR ) 10 MG tablet     Other   GAD (generalized anxiety disorder)   Relevant Medications   buPROPion  (WELLBUTRIN  XL) 150 MG 24 hr tablet   Vitamin D  deficiency   Relevant Orders   Vitamin D , 25-hydroxy   CMP14+EGFR   BMI 31.0-31.9,adult   Relevant Orders   Vitamin D , 25-hydroxy   CMP14+EGFR   CBC with Differential/Platelet   Other Visit Diagnoses       Annual physical exam    -  Primary   Relevant Orders   MM 3D SCREENING MAMMOGRAM BILATERAL BREAST     Encounter for screening for HIV       Relevant Orders   HIV antibody (with reflex)     Need for hepatitis C screening test       Relevant Orders   Hepatitis C Antibody     Encounter for screening mammogram for malignant neoplasm of breast       Relevant Orders   MM 3D SCREENING MAMMOGRAM BILATERAL BREAST     Screening for colorectal cancer       Relevant Orders   Cologuard         Hypertension Hypertension is managed with lisinopril  and amlodipine . Blood pressure was elevated at 148/87 mmHg. She reports no headaches, chest pain, or leg swelling. Discussed potential to increase lisinopril  if blood pressure remains elevated, but prefer not to increase amlodipine  due to risk of leg swelling. Elevated readings may be due to anxiety during visits. - Check blood pressure manually before she leaves - Consider increasing lisinopril  if blood pressure remains elevated  Type 2 Diabetes Mellitus Type 2 Diabetes Mellitus is well-controlled with an A1c improved from 12.6% to 5.8%. She is on Ozempic  and reports no significant side effects after dose adjustment. No numbness or tingling in feet reported. Continues to manage diet effectively. - Continue Ozempic  - Order urine test to check for proteinuria - Order comprehensive blood work including liver function, kidney function, and  blood counts - Schedule follow-up in 3-4 months to monitor diabetes management  Depression Increased anxiety and stress reported after discontinuing Wellbutrin . Expressed need to restart medication to manage symptoms. Life stressors contributing to anxiety. - Restart Wellbutrin  at 150 mg  Irritable Bowel Syndrome with Constipation (IBS-C) Ongoing constipation with approximately three bowel movements per week without medication. Linzess was effective in the past. Plan to check insurance coverage for Linzess. If not covered, alternative treatments will be considered. - Submit prescription for Linzess to check insurance coverage - Advise her to inform if insurance does not cover Linzess  General Health Maintenance Routine screenings and health maintenance are due. Eye exam is overdue. Mammogram and colon cancer screening are needed. Vitamin D  deficiency noted in the past. - Order mammogram - Order Cologuard for colon cancer screening - Advise her to  schedule eye exam - Check vitamin D  levels       Return in about 3 months (around 02/06/2024) for DM.     Kattie Parrot, FNP

## 2023-11-07 ENCOUNTER — Ambulatory Visit: Payer: Self-pay | Admitting: Family Medicine

## 2023-11-07 DIAGNOSIS — E559 Vitamin D deficiency, unspecified: Secondary | ICD-10-CM

## 2023-11-07 DIAGNOSIS — D649 Anemia, unspecified: Secondary | ICD-10-CM

## 2023-11-07 LAB — CBC WITH DIFFERENTIAL/PLATELET
Basophils Absolute: 0 10*3/uL (ref 0.0–0.2)
Basos: 0 %
EOS (ABSOLUTE): 0 10*3/uL (ref 0.0–0.4)
Eos: 0 %
Hematocrit: 32.7 % — ABNORMAL LOW (ref 34.0–46.6)
Hemoglobin: 10.9 g/dL — ABNORMAL LOW (ref 11.1–15.9)
Immature Grans (Abs): 0 10*3/uL (ref 0.0–0.1)
Immature Granulocytes: 0 %
Lymphocytes Absolute: 1.7 10*3/uL (ref 0.7–3.1)
Lymphs: 51 %
MCH: 32.7 pg (ref 26.6–33.0)
MCHC: 33.3 g/dL (ref 31.5–35.7)
MCV: 98 fL — ABNORMAL HIGH (ref 79–97)
Monocytes Absolute: 0.3 10*3/uL (ref 0.1–0.9)
Monocytes: 8 %
Neutrophils Absolute: 1.4 10*3/uL (ref 1.4–7.0)
Neutrophils: 41 %
Platelets: 281 10*3/uL (ref 150–450)
RBC: 3.33 x10E6/uL — ABNORMAL LOW (ref 3.77–5.28)
RDW: 12.4 % (ref 11.7–15.4)
WBC: 3.4 10*3/uL (ref 3.4–10.8)

## 2023-11-07 LAB — CMP14+EGFR
ALT: 8 IU/L (ref 0–32)
AST: 10 IU/L (ref 0–40)
Albumin: 4.5 g/dL (ref 3.8–4.9)
Alkaline Phosphatase: 56 IU/L (ref 44–121)
BUN/Creatinine Ratio: 13 (ref 12–28)
BUN: 10 mg/dL (ref 8–27)
Bilirubin Total: <0.2 mg/dL (ref 0.0–1.2)
CO2: 25 mmol/L (ref 20–29)
Calcium: 9.5 mg/dL (ref 8.7–10.3)
Chloride: 100 mmol/L (ref 96–106)
Creatinine, Ser: 0.78 mg/dL (ref 0.57–1.00)
Globulin, Total: 3.2 g/dL (ref 1.5–4.5)
Glucose: 85 mg/dL (ref 70–99)
Potassium: 3.9 mmol/L (ref 3.5–5.2)
Sodium: 140 mmol/L (ref 134–144)
Total Protein: 7.7 g/dL (ref 6.0–8.5)
eGFR: 87 mL/min/{1.73_m2} (ref >59)

## 2023-11-07 LAB — MICROALBUMIN / CREATININE URINE RATIO
Creatinine, Urine: 119.1 mg/dL
Microalb/Creat Ratio: 7 mg/g{creat} (ref 0–29)
Microalbumin, Urine: 8.2 ug/mL

## 2023-11-07 LAB — HIV ANTIBODY (ROUTINE TESTING W REFLEX): HIV Screen 4th Generation wRfx: NONREACTIVE

## 2023-11-07 LAB — VITAMIN D 25 HYDROXY (VIT D DEFICIENCY, FRACTURES): Vit D, 25-Hydroxy: 17.5 ng/mL — ABNORMAL LOW (ref 30.0–100.0)

## 2023-11-07 LAB — HEPATITIS C ANTIBODY: Hep C Virus Ab: NONREACTIVE

## 2023-11-07 MED ORDER — VITAMIN D (ERGOCALCIFEROL) 1.25 MG (50000 UNIT) PO CAPS: 50000.0000 [IU] | ORAL_CAPSULE | ORAL | 6 refills | Status: AC

## 2023-12-10 DIAGNOSIS — M79675 Pain in left toe(s): Secondary | ICD-10-CM | POA: Diagnosis not present

## 2023-12-10 DIAGNOSIS — M79674 Pain in right toe(s): Secondary | ICD-10-CM | POA: Diagnosis not present

## 2023-12-10 DIAGNOSIS — B351 Tinea unguium: Secondary | ICD-10-CM | POA: Diagnosis not present

## 2023-12-10 DIAGNOSIS — E1142 Type 2 diabetes mellitus with diabetic polyneuropathy: Secondary | ICD-10-CM | POA: Diagnosis not present

## 2024-01-22 ENCOUNTER — Other Ambulatory Visit: Payer: Self-pay | Admitting: Family Medicine

## 2024-01-22 DIAGNOSIS — E1169 Type 2 diabetes mellitus with other specified complication: Secondary | ICD-10-CM

## 2024-02-11 ENCOUNTER — Ambulatory Visit: Admitting: Family Medicine

## 2024-02-21 ENCOUNTER — Ambulatory Visit: Admitting: Family Medicine

## 2024-02-21 ENCOUNTER — Ambulatory Visit: Payer: Self-pay | Admitting: Family Medicine

## 2024-02-21 ENCOUNTER — Encounter: Payer: Self-pay | Admitting: Family Medicine

## 2024-02-21 VITALS — BP 127/83 | HR 86 | Temp 97.9°F | Ht 69.0 in | Wt 169.0 lb

## 2024-02-21 DIAGNOSIS — E1169 Type 2 diabetes mellitus with other specified complication: Secondary | ICD-10-CM | POA: Diagnosis not present

## 2024-02-21 DIAGNOSIS — D649 Anemia, unspecified: Secondary | ICD-10-CM

## 2024-02-21 DIAGNOSIS — E559 Vitamin D deficiency, unspecified: Secondary | ICD-10-CM

## 2024-02-21 DIAGNOSIS — K219 Gastro-esophageal reflux disease without esophagitis: Secondary | ICD-10-CM | POA: Diagnosis not present

## 2024-02-21 DIAGNOSIS — I152 Hypertension secondary to endocrine disorders: Secondary | ICD-10-CM

## 2024-02-21 DIAGNOSIS — Z7985 Long-term (current) use of injectable non-insulin antidiabetic drugs: Secondary | ICD-10-CM

## 2024-02-21 DIAGNOSIS — K581 Irritable bowel syndrome with constipation: Secondary | ICD-10-CM

## 2024-02-21 DIAGNOSIS — E1159 Type 2 diabetes mellitus with other circulatory complications: Secondary | ICD-10-CM

## 2024-02-21 DIAGNOSIS — Z1231 Encounter for screening mammogram for malignant neoplasm of breast: Secondary | ICD-10-CM

## 2024-02-21 DIAGNOSIS — E785 Hyperlipidemia, unspecified: Secondary | ICD-10-CM

## 2024-02-21 LAB — BAYER DCA HB A1C WAIVED: HB A1C (BAYER DCA - WAIVED): 5.7 % — ABNORMAL HIGH (ref 4.8–5.6)

## 2024-02-21 LAB — LIPID PANEL

## 2024-02-21 MED ORDER — AMLODIPINE BESYLATE 5 MG PO TABS: 5.0000 mg | ORAL_TABLET | Freq: Every day | ORAL | 1 refills | Status: AC

## 2024-02-21 MED ORDER — PANTOPRAZOLE SODIUM 20 MG PO TBEC: 20.0000 mg | DELAYED_RELEASE_TABLET | Freq: Every day | ORAL | 1 refills | Status: AC

## 2024-02-21 NOTE — Progress Notes (Signed)
 Subjective:  Patient ID: Kiara Kent, female    DOB: 05-20-1961, 63 y.o.   MRN: [id number]  Patient Care Team: Severa Rock HERO, FNP as PCP - General (Family Medicine) Vicci Mcardle, OD (Optometry)   Chief Complaint:  Diabetes (3 month follow up )   HPI: Kiara Kent is a 63 y.o. female presenting on 02/21/2024 for Diabetes (3 month follow up )   [name] is a 63 year old female with type 2 diabetes who presents for routine follow-up.  She reports no issues such as increased hunger, thirst, or urination. She is on 1 mg of Ozempic  weekly. Her recent A1c is 5.7, a significant improvement from 12.6 in April.  She continues to take Crestor  for cholesterol management without experiencing any muscle aches or pains. She is also on Norvasc  and lisinopril  for blood pressure control, with no reported leg swelling or other side effects.  She uses Linzess  for bowel regulation and finds that taking it every other day is effective, avoiding the need for a dose increase.        Relevant past medical, surgical, family, and social history reviewed and updated as indicated.  Allergies and medications reviewed and updated. Data reviewed: Chart in Epic.   Past Medical History:  Diagnosis Date   Anxiety    Diabetes mellitus without complication (HCC)    Hypercholesterolemia    Hypertension     Past Surgical History:  Procedure Laterality Date   CHOLECYSTECTOMY N/A 08/21/2019   Procedure: LAPAROSCOPIC CHOLECYSTECTOMY;  Surgeon: Kallie Manuelita BROCKS, MD;  Location: [facility];  Service: General;  Laterality: N/A;   TUBAL LIGATION      Social History   Socioeconomic History   Marital status: Single    Spouse name: Not on file   Number of children: Not on file   Years of education: Not on file   Highest education level: 12th grade  Occupational History   Not on file  Tobacco Use   Smoking status: Never   Smokeless tobacco: Never  Vaping Use   Vaping status: Never Used  Substance  and Sexual Activity   Alcohol use: Yes    Comment: monthly   Drug use: Never   Sexual activity: Not Currently    Birth control/protection: Surgical    Comment: tubal  Other Topics Concern   Not on file  Social History Narrative   Not on file   Social Drivers of Health   Financial Resource Strain: Low Risk  (11/06/2023)   Overall Financial Resource Strain (CARDIA)    Difficulty of Paying Living Expenses: Not very hard  Food Insecurity: Food Insecurity Present (11/06/2023)   Hunger Vital Sign    Worried About Running Out of Food in the Last Year: Sometimes true    Ran Out of Food in the Last Year: Sometimes true  Transportation Needs: No Transportation Needs (11/06/2023)   PRAPARE - Administrator, Civil Service (Medical): No    Lack of Transportation (Non-Medical): No  Physical Activity: Insufficiently Active (11/06/2023)   Exercise Vital Sign    Days of Exercise per Week: 3 days    Minutes of Exercise per Session: 20 min  Stress: Stress Concern Present (11/06/2023)   [employer] of Occupational Health - Occupational Stress Questionnaire    Feeling of Stress : To some extent  Social Connections: Moderately Integrated (11/06/2023)   Social Connection and Isolation Panel    Frequency of Communication with Friends and Family: More than three times  a week    Frequency of Social Gatherings with Friends and Family: Twice a week    Attends Religious Services: 1 to 4 times per year    Active Member of [employer] or Organizations: Yes    Attends Banker Meetings: 1 to 4 times per year    Marital Status: Divorced  Intimate Partner Violence: Not At Risk (06/07/2022)   Humiliation, Afraid, Rape, and Kick questionnaire    Fear of Current or Ex-Partner: No    Emotionally Abused: No    Physically Abused: No    Sexually Abused: No    Outpatient Encounter Medications as of 02/21/2024  Medication Sig   buPROPion  (WELLBUTRIN  XL) 150 MG 24 hr tablet TAKE 1 TABLET BY MOUTH EVERY  DAY IN THE MORNING   linaclotide  (LINZESS ) 72 MCG capsule Take 1 capsule (72 mcg total) by mouth daily before breakfast.   lisinopril  (ZESTRIL ) 20 MG tablet Take 1 tablet (20 mg total) by mouth daily.   rosuvastatin  (CRESTOR ) 10 MG tablet Take 1 tablet (10 mg total) by mouth daily.   Semaglutide , 1 MG/DOSE, (OZEMPIC , 1 MG/DOSE,) 4 MG/3ML SOPN INJECT 1 MG ONCE A WEEK AS DIRECTED   Vitamin D , Ergocalciferol , (DRISDOL ) 1.25 MG (50000 UNIT) CAPS capsule Take 1 capsule (50,000 Units total) by mouth every 7 (seven) days.   [DISCONTINUED] amLODipine  (NORVASC ) 5 MG tablet Take 1 tablet (5 mg total) by mouth daily.   [DISCONTINUED] pantoprazole  (PROTONIX ) 20 MG tablet Take 1 tablet (20 mg total) by mouth daily.   amLODipine  (NORVASC ) 5 MG tablet Take 1 tablet (5 mg total) by mouth daily.   pantoprazole  (PROTONIX ) 20 MG tablet Take 1 tablet (20 mg total) by mouth daily.   No facility-administered encounter medications on file as of 02/21/2024.    Allergies  Allergen Reactions   Metformin  And Related Nausea Only    GI intolerance    Pertinent ROS per HPI, otherwise unremarkable      Objective:  BP 127/83   Pulse 86   Temp 97.9 F (36.6 C)   Ht 5' 9 (1.753 m)   Wt 169 lb (76.7 kg)   SpO2 98%   BMI 24.96 kg/m    Wt Readings from Last 3 Encounters:  02/21/24 169 lb (76.7 kg)  11/06/23 169 lb 6.4 oz (76.8 kg)  09/19/22 186 lb 3.2 oz (84.5 kg)    Physical Exam Vitals and nursing note reviewed.  Constitutional:      General: She is not in acute distress.    Appearance: Normal appearance. She is normal weight. She is not ill-appearing, toxic-appearing or diaphoretic.  HENT:     Head: Normocephalic and atraumatic.     Mouth/Throat:     Mouth: Mucous membranes are moist.  Eyes:     Pupils: Pupils are equal, round, and reactive to light.  Cardiovascular:     Rate and Rhythm: Normal rate and regular rhythm.     Heart sounds: Normal heart sounds.  Pulmonary:     Effort: Pulmonary  effort is normal.     Breath sounds: Normal breath sounds.  Musculoskeletal:     Right lower leg: No edema.     Left lower leg: No edema.  Skin:    General: Skin is warm and dry.     Capillary Refill: Capillary refill takes less than 2 seconds.  Neurological:     General: No focal deficit present.     Mental Status: She is alert and oriented to person, place, and  time.  Psychiatric:        Mood and Affect: Mood normal.        Behavior: Behavior normal.        Thought Content: Thought content normal.        Judgment: Judgment normal.      Results for orders placed or performed in visit on 11/06/23  HIV antibody (with reflex)   Collection Time: 11/06/23  3:52 PM  Result Value Ref Range   HIV Screen 4th Generation wRfx Non Reactive Non Reactive  Hepatitis C Antibody   Collection Time: 11/06/23  3:52 PM  Result Value Ref Range   Hep C Virus Ab Non Reactive Non Reactive  Vitamin D , 25-hydroxy   Collection Time: 11/06/23  3:52 PM  Result Value Ref Range   Vit D, 25-Hydroxy 17.5 (L) 30.0 - 100.0 ng/mL  CMP14+EGFR   Collection Time: 11/06/23  3:52 PM  Result Value Ref Range   Glucose 85 70 - 99 mg/dL   BUN 10 8 - 27 mg/dL   Creatinine, Ser 9.21 0.57 - 1.00 mg/dL   eGFR 87 >40 fO/fpw/8.26   BUN/Creatinine Ratio 13 12 - 28   Sodium 140 134 - 144 mmol/L   Potassium 3.9 3.5 - 5.2 mmol/L   Chloride 100 96 - 106 mmol/L   CO2 25 20 - 29 mmol/L   Calcium  9.5 8.7 - 10.3 mg/dL   Total Protein 7.7 6.0 - 8.5 g/dL   Albumin 4.5 3.8 - 4.9 g/dL   Globulin, Total 3.2 1.5 - 4.5 g/dL   Bilirubin Total <9.7 0.0 - 1.2 mg/dL   Alkaline Phosphatase 56 44 - 121 IU/L   AST 10 0 - 40 IU/L   ALT 8 0 - 32 IU/L  CBC with Differential/Platelet   Collection Time: 11/06/23  3:52 PM  Result Value Ref Range   WBC 3.4 3.4 - 10.8 x10E3/uL   RBC 3.33 (L) 3.77 - 5.28 x10E6/uL   Hemoglobin 10.9 (L) 11.1 - 15.9 g/dL   Hematocrit 67.2 (L) 65.9 - 46.6 %   MCV 98 (H) 79 - 97 fL   MCH 32.7 26.6 - 33.0 pg    MCHC 33.3 31.5 - 35.7 g/dL   RDW 87.5 88.2 - 84.5 %   Platelets 281 150 - 450 x10E3/uL   Neutrophils 41 Not Estab. %   Lymphs 51 Not Estab. %   Monocytes 8 Not Estab. %   Eos 0 Not Estab. %   Basos 0 Not Estab. %   Neutrophils Absolute 1.4 1.4 - 7.0 x10E3/uL   Lymphocytes Absolute 1.7 0.7 - 3.1 x10E3/uL   Monocytes Absolute 0.3 0.1 - 0.9 x10E3/uL   EOS (ABSOLUTE) 0.0 0.0 - 0.4 x10E3/uL   Basophils Absolute 0.0 0.0 - 0.2 x10E3/uL   Immature Granulocytes 0 Not Estab. %   Immature Grans (Abs) 0.0 0.0 - 0.1 x10E3/uL  Microalbumin / creatinine urine ratio   Collection Time: 11/06/23  4:08 PM  Result Value Ref Range   Creatinine, Urine 119.1 Not Estab. mg/dL   Microalbumin, Urine 8.2 Not Estab. ug/mL   Microalb/Creat Ratio 7 0 - 29 mg/g creat       Pertinent labs & imaging results that were available during my care of the patient were reviewed by me and considered in my medical decision making.  Assessment & Plan:  Shontay was seen today for diabetes.  Diagnoses and all orders for this visit:  Type 2 diabetes mellitus with other specified complication, without long-term current use  of insulin (HCC) -     Lipid panel -     Bayer DCA Hb A1c Waived  Hypertension associated with diabetes (HCC) -     Lipid panel -     Bayer DCA Hb A1c Waived -     CBC with Differential/Platelet -     amLODipine  (NORVASC ) 5 MG tablet; Take 1 tablet (5 mg total) by mouth daily.  Hyperlipidemia associated with type 2 diabetes mellitus (HCC) -     Lipid panel -     Bayer DCA Hb A1c Waived -     CBC with Differential/Platelet  Gastroesophageal reflux disease without esophagitis -     CBC with Differential/Platelet -     pantoprazole  (PROTONIX ) 20 MG tablet; Take 1 tablet (20 mg total) by mouth daily.  Irritable bowel syndrome with constipation -     CBC with Differential/Platelet  Vitamin D  deficiency -     Vitamin D , 25-hydroxy  Encounter for screening mammogram for malignant neoplasm of  breast -     MM DIGITAL SCREENING BILATERAL; Future     Assessment and Plan    Type 2 diabetes mellitus Type 2 diabetes mellitus is well-controlled with an A1c of 5.7%, improved from 12.6% in April. No issues with blood glucose levels or symptoms of polyphagia, polydipsia, or polyuria. Currently on 1 mg weekly Ozempic  with no significant side effects. - Continue 1 mg weekly Ozempic . - Schedule follow-up in four months to reassess A1c and diabetes management.  Hypertension Hypertension is well-controlled with current medication regimen. Blood pressure readings are stable. Currently on Norvasc  and lisinopril  with no reported side effects such as leg swelling. - Continue Norvasc  and lisinopril  for blood pressure management. - Monitor for any leg swelling, especially if dosage changes.  Hyperlipidemia Hyperlipidemia is managed with Crestor . No reported side effects such as myalgia, which are common with statin therapy. - Continue Crestor  for hyperlipidemia management. - Monitor for any myalgia as potential side effects of Crestor .  Constipation Constipation is managed with Linzess . Bowel movements are regular with current dosing, and she is satisfied with the regimen. Linzess  is taken every other day as needed, which is effective. - Continue Linzess  as needed for constipation management. - Adjust dosing frequency based on bowel movement regularity and comfort.          Continue all other maintenance medications.  Follow up plan: Return in about 4 months (around 06/22/2024) for DM.   Continue healthy lifestyle choices, including diet (rich in fruits, vegetables, and lean proteins, and low in salt and simple carbohydrates) and exercise (at least 30 minutes of moderate physical activity daily).  Educational handout given for DM  The above assessment and management plan was discussed with the patient. The patient verbalized understanding of and has agreed to the management plan.  Patient is aware to call the clinic if they develop any new symptoms or if symptoms persist or worsen. Patient is aware when to return to the clinic for a follow-up visit. Patient educated on when it is appropriate to go to the emergency department.   Rosaline Bruns, FNP-C [facility] [phone]

## 2024-02-21 NOTE — Patient Instructions (Addendum)

## 2024-02-22 LAB — CBC WITH DIFFERENTIAL/PLATELET
Basophils Absolute: 0 x10E3/uL (ref 0.0–0.2)
Basos: 0 %
EOS (ABSOLUTE): 0 x10E3/uL (ref 0.0–0.4)
Eos: 0 %
Hematocrit: 32.3 % — ABNORMAL LOW (ref 34.0–46.6)
Hemoglobin: 10.4 g/dL — ABNORMAL LOW (ref 11.1–15.9)
Immature Grans (Abs): 0 x10E3/uL (ref 0.0–0.1)
Immature Granulocytes: 0 %
Lymphocytes Absolute: 1.4 x10E3/uL (ref 0.7–3.1)
Lymphs: 41 %
MCH: 31.9 pg (ref 26.6–33.0)
MCHC: 32.2 g/dL (ref 31.5–35.7)
MCV: 99 fL — ABNORMAL HIGH (ref 79–97)
Monocytes Absolute: 0.3 x10E3/uL (ref 0.1–0.9)
Monocytes: 10 %
Neutrophils Absolute: 1.7 x10E3/uL (ref 1.4–7.0)
Neutrophils: 49 %
Platelets: 267 x10E3/uL (ref 150–450)
RBC: 3.26 x10E6/uL — ABNORMAL LOW (ref 3.77–5.28)
RDW: 12.6 % (ref 11.7–15.4)
WBC: 3.4 x10E3/uL (ref 3.4–10.8)

## 2024-02-22 LAB — LIPID PANEL
Chol/HDL Ratio: 3.5 ratio (ref 0.0–4.4)
Cholesterol, Total: 170 mg/dL (ref 100–199)
HDL: 48 mg/dL (ref >39)
LDL Chol Calc (NIH): 104 mg/dL — ABNORMAL HIGH (ref 0–99)
Triglycerides: 98 mg/dL (ref 0–149)
VLDL Cholesterol Cal: 18 mg/dL (ref 5–40)

## 2024-02-22 LAB — VITAMIN D 25 HYDROXY (VIT D DEFICIENCY, FRACTURES): Vit D, 25-Hydroxy: 44.7 ng/mL (ref 30.0–100.0)

## 2024-03-02 NOTE — Telephone Encounter (Signed)
 Patient aware and verbalizes understanding.  Denies any bleeding, bruising and any dark tarry stool.  Appointment scheduled and future lab placed.

## 2024-03-23 ENCOUNTER — Other Ambulatory Visit

## 2024-03-23 DIAGNOSIS — D649 Anemia, unspecified: Secondary | ICD-10-CM | POA: Diagnosis not present

## 2024-03-24 ENCOUNTER — Ambulatory Visit: Payer: Self-pay | Admitting: Family Medicine

## 2024-03-24 DIAGNOSIS — D508 Other iron deficiency anemias: Secondary | ICD-10-CM

## 2024-03-24 LAB — ANEMIA PROFILE B
Basophils Absolute: 0 x10E3/uL (ref 0.0–0.2)
Basos: 0 %
EOS (ABSOLUTE): 0 x10E3/uL (ref 0.0–0.4)
Eos: 1 %
Ferritin: 329 ng/mL — ABNORMAL HIGH (ref 15–150)
Folate: 5.7 ng/mL (ref >3.0)
Hematocrit: 30.1 % — ABNORMAL LOW (ref 34.0–46.6)
Hemoglobin: 9.8 g/dL — ABNORMAL LOW (ref 11.1–15.9)
Immature Grans (Abs): 0 x10E3/uL (ref 0.0–0.1)
Immature Granulocytes: 0 %
Iron Saturation: 20 % (ref 15–55)
Iron: 49 ug/dL (ref 27–139)
Lymphocytes Absolute: 1.3 x10E3/uL (ref 0.7–3.1)
Lymphs: 39 %
MCH: 32.1 pg (ref 26.6–33.0)
MCHC: 32.6 g/dL (ref 31.5–35.7)
MCV: 99 fL — ABNORMAL HIGH (ref 79–97)
Monocytes Absolute: 0.2 x10E3/uL (ref 0.1–0.9)
Monocytes: 7 %
Neutrophils Absolute: 1.8 x10E3/uL (ref 1.4–7.0)
Neutrophils: 53 %
Platelets: 287 x10E3/uL (ref 150–450)
RBC: 3.05 x10E6/uL — ABNORMAL LOW (ref 3.77–5.28)
RDW: 12.5 % (ref 11.7–15.4)
Retic Ct Pct: 1.4 % (ref 0.6–2.6)
Total Iron Binding Capacity: 246 ug/dL — ABNORMAL LOW (ref 250–450)
UIBC: 197 ug/dL (ref 118–369)
Vitamin B-12: 325 pg/mL (ref 232–1245)
WBC: 3.3 x10E3/uL — ABNORMAL LOW (ref 3.4–10.8)

## 2024-03-24 MED ORDER — IRON (FERROUS SULFATE) 325 (65 FE) MG PO TABS: 325.0000 mg | ORAL_TABLET | Freq: Every day | ORAL | 3 refills | Status: DC

## 2024-03-31 NOTE — Progress Notes (Signed)
 Pt r/c.

## 2024-04-15 ENCOUNTER — Other Ambulatory Visit

## 2024-04-15 DIAGNOSIS — D508 Other iron deficiency anemias: Secondary | ICD-10-CM

## 2024-04-16 ENCOUNTER — Other Ambulatory Visit: Payer: Self-pay | Admitting: *Deleted

## 2024-04-16 ENCOUNTER — Ambulatory Visit: Payer: Self-pay | Admitting: Family Medicine

## 2024-04-16 DIAGNOSIS — E1169 Type 2 diabetes mellitus with other specified complication: Secondary | ICD-10-CM

## 2024-04-16 LAB — FECAL OCCULT BLOOD, IMMUNOCHEMICAL: Fecal Occult Bld: POSITIVE — AB

## 2024-04-17 ENCOUNTER — Inpatient Hospital Stay: Admitting: Hematology

## 2024-04-17 ENCOUNTER — Inpatient Hospital Stay

## 2024-04-20 ENCOUNTER — Telehealth: Payer: Self-pay

## 2024-04-20 DIAGNOSIS — R195 Other fecal abnormalities: Secondary | ICD-10-CM

## 2024-04-20 NOTE — Telephone Encounter (Signed)
 Copied from CRM #[id number]. Topic: Clinical - Lab/Test Results >> Apr 20, 2024  3:38 PM Wess RAMAN wrote: Reason for CRM: Patient missed call from [place], [place] and would like a call to discuss lab results  Callback #: [phone]

## 2024-04-21 NOTE — Telephone Encounter (Signed)
 Left detailed message per signed DPR. Encouraged call back or send us  a Mychart message if there are any questions.

## 2024-04-21 NOTE — Telephone Encounter (Signed)
 I called and spoke with patient and reviewed FOBT results with her. She voiced understanding. Would like GI referral to be sent somewhere in [place] or [place].

## 2024-04-21 NOTE — Addendum Note (Signed)
 Addended by: SEVERA ROCK HERO on: 04/21/2024 01:03 PM   Modules accepted: Orders

## 2024-05-25 ENCOUNTER — Encounter: Payer: Self-pay | Admitting: Internal Medicine

## 2024-06-01 ENCOUNTER — Encounter: Payer: Self-pay | Admitting: *Deleted

## 2024-06-23 ENCOUNTER — Telehealth: Payer: Self-pay | Admitting: Family Medicine

## 2024-06-23 ENCOUNTER — Ambulatory Visit: Payer: Self-pay | Admitting: Family Medicine

## 2024-06-23 ENCOUNTER — Encounter: Payer: Self-pay | Admitting: Family Medicine

## 2024-06-23 VITALS — BP 125/77 | HR 89 | Temp 97.8°F | Ht 69.0 in | Wt 169.4 lb

## 2024-06-23 DIAGNOSIS — R195 Other fecal abnormalities: Secondary | ICD-10-CM | POA: Diagnosis not present

## 2024-06-23 DIAGNOSIS — I152 Hypertension secondary to endocrine disorders: Secondary | ICD-10-CM

## 2024-06-23 DIAGNOSIS — E785 Hyperlipidemia, unspecified: Secondary | ICD-10-CM | POA: Diagnosis not present

## 2024-06-23 DIAGNOSIS — K581 Irritable bowel syndrome with constipation: Secondary | ICD-10-CM

## 2024-06-23 DIAGNOSIS — N898 Other specified noninflammatory disorders of vagina: Secondary | ICD-10-CM

## 2024-06-23 DIAGNOSIS — F411 Generalized anxiety disorder: Secondary | ICD-10-CM | POA: Diagnosis not present

## 2024-06-23 DIAGNOSIS — E1159 Type 2 diabetes mellitus with other circulatory complications: Secondary | ICD-10-CM | POA: Diagnosis not present

## 2024-06-23 DIAGNOSIS — E1169 Type 2 diabetes mellitus with other specified complication: Secondary | ICD-10-CM

## 2024-06-23 DIAGNOSIS — D508 Other iron deficiency anemias: Secondary | ICD-10-CM | POA: Diagnosis not present

## 2024-06-23 LAB — BAYER DCA HB A1C WAIVED: HB A1C (BAYER DCA - WAIVED): 5.6 % (ref 4.8–5.6)

## 2024-06-23 MED ORDER — LISINOPRIL 20 MG PO TABS: 20.0000 mg | ORAL_TABLET | Freq: Every day | ORAL | 1 refills | Status: AC

## 2024-06-23 MED ORDER — LINACLOTIDE 72 MCG PO CAPS: 72.0000 ug | ORAL_CAPSULE | Freq: Every day | ORAL | 6 refills | Status: DC

## 2024-06-23 MED ORDER — ROSUVASTATIN CALCIUM 10 MG PO TABS: 10.0000 mg | ORAL_TABLET | Freq: Every day | ORAL | 1 refills | Status: AC

## 2024-06-23 MED ORDER — BUPROPION HCL ER (XL) 150 MG PO TB24: ORAL_TABLET | ORAL | 1 refills | Status: AC

## 2024-06-23 MED ORDER — IRON (FERROUS SULFATE) 325 (65 FE) MG PO TABS: 325.0000 mg | ORAL_TABLET | Freq: Every day | ORAL | 3 refills | Status: AC

## 2024-06-23 NOTE — Telephone Encounter (Signed)
 LINZESS  72 MCG capsule   Pharmacy comment: Alternative Requested:PRIOR AUTH NEEDED.

## 2024-06-23 NOTE — Progress Notes (Signed)
 "    Subjective:  Patient ID: Kiara Kent, female    DOB: 15-Oct-1960, 64 y.o.   MRN: [id number]  Patient Care Team: Severa Rock HERO, FNP as PCP - General (Family Medicine) Vicci Mcardle, OD (Optometry)   Chief Complaint:  Diabetes (4 month follow up )   HPI: Kiara Kent is a 64 y.o. female presenting on 06/23/2024 for Diabetes (4 month follow up )   Kiara Kent is a 64 year old female with diabetes and iron  deficiency anemia who presents for a diabetes check and follow-up on her iron  levels.  She has ongoing issues with low iron  levels and was previously referred to a GI doctor but was unable to attend due to transportation issues. She is now ready to pursue this appointment and requests a new referral. She is currently taking her iron  supplements as prescribed.  She is on a regimen of 1 mg weekly Ozempic  for diabetes management and reports doing well with it after initial nausea. Her last A1c was 5.6. She is also taking Crestor  for cholesterol, lisinopril , and amlodipine  for blood pressure, with no reported side effects such as leg swelling. She continues to take Wellbutrin  for depression and reports feeling well.  She mentions a new concern of an unusual vaginal odor, which she describes as 'unknown to me' and 'fishy'. No vaginal discharge. She has been using douches and scrubbing, which may exacerbate the issue.          Relevant past medical, surgical, family, and social history reviewed and updated as indicated.  Allergies and medications reviewed and updated. Data reviewed: Chart in Epic.   Past Medical History:  Diagnosis Date   Anxiety    Diabetes mellitus without complication (HCC)    Hypercholesterolemia    Hypertension     Past Surgical History:  Procedure Laterality Date   CHOLECYSTECTOMY N/A 08/21/2019   Procedure: LAPAROSCOPIC CHOLECYSTECTOMY;  Surgeon: Kallie Manuelita BROCKS, MD;  Location: [facility];  Service: General;  Laterality: N/A;   TUBAL LIGATION       Social History   Socioeconomic History   Marital status: Single    Spouse name: Not on file   Number of children: Not on file   Years of education: Not on file   Highest education level: 12th grade  Occupational History   Not on file  Tobacco Use   Smoking status: Never   Smokeless tobacco: Never  Vaping Use   Vaping status: Never Used  Substance and Sexual Activity   Alcohol use: Yes    Comment: monthly   Drug use: Never   Sexual activity: Not Currently    Birth control/protection: Surgical    Comment: tubal  Other Topics Concern   Not on file  Social History Narrative   Not on file   Social Drivers of Health   Tobacco Use: Low Risk (06/23/2024)   Patient History    Smoking Tobacco Use: Never    Smokeless Tobacco Use: Never    Passive Exposure: Not on file  Financial Resource Strain: Medium Risk (06/22/2024)   Overall Financial Resource Strain (CARDIA)    Difficulty of Paying Living Expenses: Somewhat hard  Food Insecurity: Patient Declined (06/22/2024)   Epic    Worried About Programme Researcher, Broadcasting/film/video in the Last Year: Patient declined    Barista in the Last Year: Patient declined  Transportation Needs: Unmet Transportation Needs (06/22/2024)   Epic    Lack of Transportation (Medical): Yes    Lack  of Transportation (Non-Medical): No  Physical Activity: Inactive (06/22/2024)   Exercise Vital Sign    Days of Exercise per Week: 5 days    Minutes of Exercise per Session: 0 min  Stress: No Stress Concern Present (06/22/2024)   [employer] of Occupational Health - Occupational Stress Questionnaire    Feeling of Stress: Only a little  Social Connections: Moderately Integrated (06/22/2024)   Social Connection and Isolation Panel    Frequency of Communication with Friends and Family: More than three times a week    Frequency of Social Gatherings with Friends and Family: Three times a week    Attends Religious Services: Patient declined    Active Member of  Clubs or Organizations: Yes    Attends Banker Meetings: Patient declined    Marital Status: Living with partner  Intimate Partner Violence: Not At Risk (06/07/2022)   Humiliation, Afraid, Rape, and Kick questionnaire    Fear of Current or Ex-Partner: No    Emotionally Abused: No    Physically Abused: No    Sexually Abused: No  Depression (PHQ2-9): Low Risk (02/21/2024)   Depression (PHQ2-9)    PHQ-2 Score: 1  Alcohol Screen: Low Risk (06/07/2022)   Alcohol Screen    Last Alcohol Screening Score (AUDIT): 1  Housing: High Risk (06/22/2024)   Epic    Unable to Pay for Housing in the Last Year: Yes    Number of Times Moved in the Last Year: 0    Homeless in the Last Year: No  Utilities: Not At Risk (06/07/2022)   [facility] Utilities    Threatened with loss of utilities: No  Health Literacy: Not on file    Outpatient Encounter Medications as of 06/23/2024  Medication Sig   amLODipine  (NORVASC ) 5 MG tablet Take 1 tablet (5 mg total) by mouth daily.   buPROPion  (WELLBUTRIN  XL) 150 MG 24 hr tablet TAKE 1 TABLET BY MOUTH EVERY DAY IN THE MORNING   Iron , Ferrous Sulfate , 325 (65 Fe) MG TABS Take 325 mg by mouth daily.   linaclotide  (LINZESS ) 72 MCG capsule Take 1 capsule (72 mcg total) by mouth daily before breakfast.   lisinopril  (ZESTRIL ) 20 MG tablet Take 1 tablet (20 mg total) by mouth daily.   pantoprazole  (PROTONIX ) 20 MG tablet Take 1 tablet (20 mg total) by mouth daily.   rosuvastatin  (CRESTOR ) 10 MG tablet Take 1 tablet (10 mg total) by mouth daily.   Semaglutide , 1 MG/DOSE, (OZEMPIC , 1 MG/DOSE,) 4 MG/3ML SOPN INJECT 1 MG ONCE A WEEK AS DIRECTED   Vitamin D , Ergocalciferol , (DRISDOL ) 1.25 MG (50000 UNIT) CAPS capsule Take 1 capsule (50,000 Units total) by mouth every 7 (seven) days.   [DISCONTINUED] buPROPion  (WELLBUTRIN  XL) 150 MG 24 hr tablet TAKE 1 TABLET BY MOUTH EVERY DAY IN THE MORNING   [DISCONTINUED] Iron , Ferrous Sulfate , 325 (65 Fe) MG TABS Take 325 mg by mouth daily.    [DISCONTINUED] linaclotide  (LINZESS ) 72 MCG capsule Take 1 capsule (72 mcg total) by mouth daily before breakfast.   [DISCONTINUED] lisinopril  (ZESTRIL ) 20 MG tablet Take 1 tablet (20 mg total) by mouth daily.   [DISCONTINUED] rosuvastatin  (CRESTOR ) 10 MG tablet Take 1 tablet (10 mg total) by mouth daily.   No facility-administered encounter medications on file as of 06/23/2024.    Allergies[1]  Pertinent ROS per HPI, otherwise unremarkable      Objective:  BP 125/77   Pulse 89   Temp 97.8 F (36.6 C)   Ht 5' 9 (1.753  m)   Wt 169 lb 6.4 oz (76.8 kg)   SpO2 100%   BMI 25.02 kg/m    Wt Readings from Last 3 Encounters:  06/23/24 169 lb 6.4 oz (76.8 kg)  02/21/24 169 lb (76.7 kg)  11/06/23 169 lb 6.4 oz (76.8 kg)    Physical Exam Vitals and nursing note reviewed.  Constitutional:      General: She is not in acute distress.    Appearance: Normal appearance. She is well-developed, well-groomed and normal weight. She is not ill-appearing, toxic-appearing or diaphoretic.  HENT:     Head: Normocephalic and atraumatic.     Jaw: There is normal jaw occlusion.     Right Ear: Hearing normal.     Left Ear: Hearing normal.     Nose: Nose normal.     Mouth/Throat:     Lips: Pink.     Mouth: Mucous membranes are moist.     Pharynx: Oropharynx is clear. Uvula midline.  Eyes:     General: Lids are normal.     Extraocular Movements: Extraocular movements intact.     Conjunctiva/sclera: Conjunctivae normal.     Pupils: Pupils are equal, round, and reactive to light.  Neck:     Trachea: Trachea and phonation normal.  Cardiovascular:     Rate and Rhythm: Normal rate and regular rhythm.     Chest Wall: PMI is not displaced.     Pulses: Normal pulses.     Heart sounds: Normal heart sounds. No murmur heard.    No friction rub. No gallop.  Pulmonary:     Effort: Pulmonary effort is normal. No respiratory distress.     Breath sounds: Normal breath sounds. No wheezing.  Abdominal:      General: Bowel sounds are normal.     Palpations: Abdomen is soft.  Musculoskeletal:        General: Normal range of motion.     Cervical back: Normal range of motion and neck supple.     Right lower leg: No edema.     Left lower leg: No edema.  Skin:    General: Skin is warm and dry.     Capillary Refill: Capillary refill takes less than 2 seconds.     Coloration: Skin is not cyanotic, jaundiced or pale.     Findings: No rash.  Neurological:     General: No focal deficit present.     Mental Status: She is alert and oriented to person, place, and time.     Sensory: Sensation is intact.     Motor: Motor function is intact.     Coordination: Coordination is intact.     Gait: Gait is intact.     Deep Tendon Reflexes: Reflexes are normal and symmetric.  Psychiatric:        Attention and Perception: Attention and perception normal.        Mood and Affect: Mood and affect normal.        Speech: Speech normal.        Behavior: Behavior normal. Behavior is cooperative.        Thought Content: Thought content normal.        Cognition and Memory: Cognition and memory normal.        Judgment: Judgment normal.      Results for orders placed or performed in visit on 04/15/24  Fecal occult blood, imunochemical   Collection Time: 04/15/24  2:16 PM   Specimen: Stool   UR  Result Value  Ref Range   Fecal Occult Bld Positive (A) Negative       Pertinent labs & imaging results that were available during my care of the patient were reviewed by me and considered in my medical decision making.  Assessment & Plan:  Kiara Kent was seen today for diabetes.  Diagnoses and all orders for this visit:  Type 2 diabetes mellitus with other specified complication, without long-term current use of insulin (HCC) -     Bayer DCA Hb A1c Waived -     Anemia Profile B  Hypertension associated with diabetes (HCC) -     Bayer DCA Hb A1c Waived -     lisinopril  (ZESTRIL ) 20 MG tablet; Take 1 tablet (20 mg  total) by mouth daily.  Hyperlipidemia associated with type 2 diabetes mellitus (HCC) -     Bayer DCA Hb A1c Waived -     rosuvastatin  (CRESTOR ) 10 MG tablet; Take 1 tablet (10 mg total) by mouth daily.  GAD (generalized anxiety disorder) -     Anemia Profile B -     buPROPion  (WELLBUTRIN  XL) 150 MG 24 hr tablet; TAKE 1 TABLET BY MOUTH EVERY DAY IN THE MORNING  Other iron  deficiency anemia -     Anemia Profile B -     Iron , Ferrous Sulfate , 325 (65 Fe) MG TABS; Take 325 mg by mouth daily. -     Ambulatory referral to Gastroenterology  Irritable bowel syndrome with constipation -     linaclotide  (LINZESS ) 72 MCG capsule; Take 1 capsule (72 mcg total) by mouth daily before breakfast. -     Ambulatory referral to Gastroenterology  Positive fecal occult blood test -     Ambulatory referral to Gastroenterology  Vaginal odor -     NuSwab Vaginitis Plus (VG+)       Type 2 diabetes mellitus with other specified complication Type 2 diabetes mellitus is well-controlled with Ozempic  1 mg weekly. Last A1c was 5.6, indicating good glycemic control. - Continue Ozempic  1 mg weekly  Other iron  deficiency anemia Iron  deficiency anemia with previous positive fecal occult blood test. Referral to GI was not completed due to transportation issues. Currently taking iron  supplements. - Sent new referral to GI for evaluation of positive fecal occult blood test - Continue iron  supplementation  Positive fecal occult blood test Previous positive fecal occult blood test. Referral to GI was not completed due to transportation issues. - Sent new referral to GI for evaluation of positive fecal occult blood test  Vaginal odor Without discharge, likely due to bacterial vaginosis. No discharge observed. Advised against douching as it can worsen symptoms. - Performed vaginal swab to test for bacterial vaginosis and other infections - Advised against douching and recommended using non-antibacterial soap like  Vagisil, Dove, or Ivory  Depression Well-managed with Wellbutrin . - Continue Wellbutrin           Continue all other maintenance medications.  Follow up plan: Return in about 5 months (around 11/21/2024), or if symptoms worsen or fail to improve, for DM, HTN.   Continue healthy lifestyle choices, including diet (rich in fruits, vegetables, and lean proteins, and low in salt and simple carbohydrates) and exercise (at least 30 minutes of moderate physical activity daily).  Educational handout given for DM  The above assessment and management plan was discussed with the patient. The patient verbalized understanding of and has agreed to the management plan. Patient is aware to call the clinic if they develop any new symptoms or if  symptoms persist or worsen. Patient is aware when to return to the clinic for a follow-up visit. Patient educated on when it is appropriate to go to the emergency department.   Kiara Bruns, FNP-C [facility] [phone]     [1]  Allergies Allergen Reactions   Metformin  And Related Nausea Only    GI intolerance   "

## 2024-06-23 NOTE — Patient Instructions (Addendum)

## 2024-06-24 ENCOUNTER — Other Ambulatory Visit (HOSPITAL_COMMUNITY): Payer: Self-pay

## 2024-06-24 ENCOUNTER — Telehealth: Payer: Self-pay

## 2024-06-24 ENCOUNTER — Ambulatory Visit: Payer: Self-pay | Admitting: Family Medicine

## 2024-06-24 DIAGNOSIS — K581 Irritable bowel syndrome with constipation: Secondary | ICD-10-CM

## 2024-06-24 DIAGNOSIS — B9689 Other specified bacterial agents as the cause of diseases classified elsewhere: Secondary | ICD-10-CM

## 2024-06-24 LAB — ANEMIA PROFILE B
Basophils Absolute: 0 x10E3/uL (ref 0.0–0.2)
Basos: 1 %
EOS (ABSOLUTE): 0 x10E3/uL (ref 0.0–0.4)
Eos: 0 %
Ferritin: 373 ng/mL — ABNORMAL HIGH (ref 15–150)
Folate: 6.2 ng/mL
Hematocrit: 33.1 % — ABNORMAL LOW (ref 34.0–46.6)
Hemoglobin: 11 g/dL — ABNORMAL LOW (ref 11.1–15.9)
Immature Grans (Abs): 0 x10E3/uL (ref 0.0–0.1)
Immature Granulocytes: 0 %
Iron Saturation: 20 % (ref 15–55)
Iron: 48 ug/dL (ref 27–139)
Lymphocytes Absolute: 1.4 x10E3/uL (ref 0.7–3.1)
Lymphs: 36 %
MCH: 32.4 pg (ref 26.6–33.0)
MCHC: 33.2 g/dL (ref 31.5–35.7)
MCV: 97 fL (ref 79–97)
Monocytes Absolute: 0.3 x10E3/uL (ref 0.1–0.9)
Monocytes: 8 %
Neutrophils Absolute: 2.1 x10E3/uL (ref 1.4–7.0)
Neutrophils: 55 %
Platelets: 273 x10E3/uL (ref 150–450)
RBC: 3.4 x10E6/uL — ABNORMAL LOW (ref 3.77–5.28)
RDW: 12 % (ref 11.7–15.4)
Retic Ct Pct: 1.1 % (ref 0.6–2.6)
Total Iron Binding Capacity: 243 ug/dL — ABNORMAL LOW (ref 250–450)
UIBC: 195 ug/dL (ref 118–369)
Vitamin B-12: 374 pg/mL (ref 232–1245)
WBC: 3.8 x10E3/uL (ref 3.4–10.8)

## 2024-06-24 MED ORDER — TRULANCE 3 MG PO TABS: 3.0000 mg | ORAL_TABLET | Freq: Every day | ORAL | 1 refills | Status: AC

## 2024-06-24 NOTE — Telephone Encounter (Signed)
 Called and spoke with patient and made her aware of change. She voiced understanding.

## 2024-06-24 NOTE — Telephone Encounter (Signed)
 LINZESS  72 MCG capsule   Pharmacy comment: Alternative Requested:INSURANCE REQUIRES PRIOR AUTH.

## 2024-06-24 NOTE — Telephone Encounter (Signed)
 Pharmacy Patient Advocate Encounter   Received notification from Physician's Office that prior authorization for Linzess  72 mcg  is required/requested.   Insurance verification completed.   The patient is insured through [facility].   Per test claim:  [facility]ulance  is preferred by the insurance.  If suggested medication is appropriate, Please send in a new RX and discontinue this one. If not, please advise as to why it's not appropriate so that we may request a Prior Authorization. Please note, some preferred medications may still require a PA.  If the suggested medications have not been trialed and there are no contraindications to their use, the PA will not be submitted, as it will not be approved. Archived Key: na

## 2024-06-25 LAB — NUSWAB VAGINITIS PLUS (VG+)
Atopobium vaginae: HIGH {score} — AB
BVAB 2: HIGH {score} — AB
Candida albicans, NAA: NEGATIVE
Candida glabrata, NAA: NEGATIVE
Megasphaera 1: HIGH {score} — AB

## 2024-06-25 MED ORDER — METRONIDAZOLE 500 MG PO TABS: 500.0000 mg | ORAL_TABLET | Freq: Two times a day (BID) | ORAL | 0 refills | Status: AC

## 2024-07-01 NOTE — Telephone Encounter (Signed)
 Duplicate encounter- addressed in previous encounter

## 2024-07-01 NOTE — Telephone Encounter (Signed)
 Addressed in other encounter.

## 2024-07-23 ENCOUNTER — Ambulatory Visit: Admitting: Gastroenterology

## 2024-11-24 ENCOUNTER — Ambulatory Visit: Admitting: Family Medicine
